# Patient Record
Sex: Male | Born: 1970 | Race: Black or African American | Hispanic: No | Marital: Single | Smoking: Never smoker
Health system: Southern US, Academic
[De-identification: ages and names within clinical notes are randomized; demographics above are authoritative.]

## PROBLEM LIST (undated history)

## (undated) DIAGNOSIS — M1990 Unspecified osteoarthritis, unspecified site: Secondary | ICD-10-CM

## (undated) DIAGNOSIS — I10 Essential (primary) hypertension: Secondary | ICD-10-CM

## (undated) DIAGNOSIS — E119 Type 2 diabetes mellitus without complications: Secondary | ICD-10-CM

## (undated) DIAGNOSIS — G4730 Sleep apnea, unspecified: Secondary | ICD-10-CM

## (undated) DIAGNOSIS — Z87898 Personal history of other specified conditions: Secondary | ICD-10-CM

## (undated) DIAGNOSIS — E785 Hyperlipidemia, unspecified: Secondary | ICD-10-CM

## (undated) DIAGNOSIS — Z9989 Dependence on other enabling machines and devices: Secondary | ICD-10-CM

## (undated) DIAGNOSIS — R6889 Other general symptoms and signs: Secondary | ICD-10-CM

## (undated) DIAGNOSIS — M109 Gout, unspecified: Secondary | ICD-10-CM

## (undated) DIAGNOSIS — I82409 Acute embolism and thrombosis of unspecified deep veins of unspecified lower extremity: Secondary | ICD-10-CM

## (undated) HISTORY — DX: Gout, unspecified: M10.9

## (undated) HISTORY — DX: Type 2 diabetes mellitus without complications: E11.9

## (undated) HISTORY — DX: Sleep apnea, unspecified: G47.30

## (undated) HISTORY — DX: Unspecified osteoarthritis, unspecified site: M19.90

## (undated) HISTORY — PX: MOUTH SURGERY: SHX715

---
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
## 2015-08-02 ENCOUNTER — Emergency Department
Admission: EM | Admit: 2015-08-02 | Discharge: 2015-08-02 | Disposition: A | Discharge status: Home / Self Care | Payer: Self-pay | Attending: Emergency Medicine | Admitting: Emergency Medicine

## 2015-08-02 ENCOUNTER — Encounter (HOSPITAL_COMMUNITY): Payer: Self-pay

## 2015-08-02 ENCOUNTER — Emergency Department (EMERGENCY_DEPARTMENT_HOSPITAL): Payer: Self-pay

## 2015-08-02 ENCOUNTER — Emergency Department (HOSPITAL_COMMUNITY): Payer: Self-pay

## 2015-08-02 DIAGNOSIS — R109 Unspecified abdominal pain: Secondary | ICD-10-CM

## 2015-08-02 DIAGNOSIS — R1032 Left lower quadrant pain: Secondary | ICD-10-CM

## 2015-08-02 DIAGNOSIS — I10 Essential (primary) hypertension: Secondary | ICD-10-CM | POA: Insufficient documentation

## 2015-08-02 HISTORY — DX: Essential (primary) hypertension: I10

## 2015-08-02 LAB — URINALYSIS, MACROSCOPIC
BILIRUBIN: NEGATIVE mg/dL
BLOOD: NEGATIVE mg/dL
COLOR: NORMAL
GLUCOSE: NEGATIVE mg/dL
KETONES: NEGATIVE mg/dL
LEUKOCYTES: NEGATIVE WBCs/uL
NITRITE: NEGATIVE
PH: 7 (ref 5.0–8.0)
PROTEIN: NEGATIVE mg/dL
SPECIFIC GRAVITY: 1.023 (ref 1.005–1.030)
UROBILINOGEN: NEGATIVE mg/dL

## 2015-08-02 LAB — CBC WITH DIFF
BASOPHIL #: 0.06 x10ˆ3/uL (ref 0.00–0.20)
BASOPHIL %: 1 %
EOSINOPHIL #: 0.08 x10ˆ3/uL (ref 0.00–0.50)
EOSINOPHIL %: 2 %
HCT: 41.9 % (ref 36.7–47.0)
HGB: 13.4 g/dL (ref 12.5–16.3)
LYMPHOCYTE #: 1.38 x10ˆ3/uL (ref 1.00–4.80)
LYMPHOCYTE %: 29 %
MCH: 27.9 pg (ref 27.4–33.0)
MCHC: 32.1 g/dL — ABNORMAL LOW (ref 32.5–35.8)
MCV: 87 fL (ref 78.0–100.0)
MONOCYTE #: 0.5 x10ˆ3/uL (ref 0.30–1.00)
MONOCYTE %: 11 %
MPV: 7.4 fL — ABNORMAL LOW (ref 7.5–11.5)
NEUTROPHIL #: 2.71 x10ˆ3/uL (ref 1.50–7.70)
NEUTROPHIL %: 57 %
PLATELETS: 272 x10?3/uL (ref 140–450)
RBC: 4.82 x10ˆ6/uL (ref 4.06–5.63)
RDW: 13.6 % (ref 12.0–15.0)
WBC: 4.7 x10ˆ3/uL (ref 3.5–11.0)

## 2015-08-02 LAB — BASIC METABOLIC PANEL
ANION GAP: 9 mmol/L (ref 4–13)
BUN/CREA RATIO: 13 (ref 6–22)
BUN: 16 mg/dL (ref 8–25)
CALCIUM: 9.4 mg/dL (ref 8.5–10.2)
CHLORIDE: 102 mmol/L (ref 96–111)
CO2 TOTAL: 30 mmol/L (ref 22–32)
CO2 TOTAL: 30 mmol/L (ref 22–32)
CREATININE: 1.19 mg/dL (ref 0.62–1.27)
ESTIMATED GFR: >59 mL/min/1.73mˆ2 (ref >59)
GLUCOSE: 115 mg/dL (ref 65–139)
POTASSIUM: 3.5 mmol/L (ref 3.5–5.1)
SODIUM: 141 mmol/L (ref 136–145)

## 2015-08-02 MED ORDER — CYCLOBENZAPRINE 5 MG TABLET
5.00 mg | ORAL_TABLET | Freq: Three times a day (TID) | ORAL | 0 refills | Status: DC
Start: 2015-08-02 — End: 2019-08-18

## 2015-08-02 MED ORDER — CYCLOBENZAPRINE 10 MG TABLET
10.00 mg | ORAL_TABLET | ORAL | Status: AC
Start: 2015-08-02 — End: 2015-08-02
  Administered 2015-08-02: 10 mg via ORAL
  Filled 2015-08-02: qty 1

## 2015-08-02 MED ORDER — DIAZEPAM 5 MG/ML INJECTION SYRINGE
2.50 mg | INJECTION | INTRAMUSCULAR | Status: AC
Start: 2015-08-02 — End: 2015-08-02
  Administered 2015-08-02: 2.5 mg via INTRAVENOUS
  Filled 2015-08-02 (×2): qty 2

## 2015-08-02 MED ORDER — SODIUM CHLORIDE 0.9 % IV BOLUS
1000.00 mL | INJECTION | Status: AC
Start: 2015-08-02 — End: 2015-08-02
  Administered 2015-08-02: 0 mL via INTRAVENOUS
  Administered 2015-08-02: 1000 mL via INTRAVENOUS

## 2015-08-02 NOTE — ED Provider Notes (Signed)
Department of Emergency Medicine  HPI - 08/02/2015    Attending: Dr. Read Drivers  Resident: Dr. Annamaria Boots  Chief Complaint:   Flank Pain  History of Present Illness:   Austin Richmond, 45 y.o. male who presents to the ED c/o L flank pain.   Onset 2 days ago while lying in bed. Reports he was rolling over when he had acute onset of pain.   He describes the pain as throbbing in nature and is worsened with movement.    Pt reports he woke up the following morning with decreased L flank pain and notes improvements in pain sx as the day progressed.   Reports reoccurrence in pain sx last evening. States his sx were much worse than initial onset.    Pt states he has been drinking large amounts of water for sx management but denies improvements.    He denies recent falls or traumas.    Pt denies a hx of similar sx.    Denies fever, chills, chest pain, SOB, nausea, vomiting, diarrhea, hematochezia, constipation, dysuria, hematuria, numbness tingling, weakness or any other complaints at this time.    No hx of kidney stones per pt.    PMHx significant for HTN    Review of Systems:      Constitutional: No fever, chills or weakness  Skin: No rashes or diaphoresis  HENT: No headache  Eyes: No vision changes  Cardio: No chest pain  Respiratory: No SOB  GI:  No nausea, vomiting, diarrhea, hematochezia or abdominal pain  GU:  No dysuria, hematuria  MSK: +L flank pain  Neuro: No loss of sensation or focal deficits  Psych: No mood changes  All other systems reviewed and are negative     Medications:  None       Allergies:  No Known Allergies    Past Medical History:  Past Medical History:   Diagnosis Date    HTN (hypertension)          Past Surgical History:  No past surgical history on file.        Social History:  Social History     Social History    Marital status: Married     Spouse name: N/A    Number of children: N/A    Years of education: N/A     Occupational History    Not on file.     Social History Main Topics      Smoking status: Never Smoker    Smokeless tobacco: Not on file    Alcohol use No    Drug use: No    Sexual activity: Yes     Partners: Female     Other Topics Concern    Not on file     Social History Narrative    No narrative on file       Family History:  No family history on file.        Physical Exam:  All nurse's notes reviewed.    ED Triage Vitals   Enc Vitals Group      BP (Non-Invasive) 08/02/15 1359 142/94      Heart Rate 08/02/15 1358 84      Respiratory Rate 08/02/15 1358 20      Temperature 08/02/15 1358 37 C (98.6 F)      Temp src --       SpO2-1 08/02/15 1358 97 %      Weight 08/02/15 1358 145.9 kg (321 lb 10.4 oz)  Height 08/02/15 1358 1.854 m (6\' 1" )      Head Cir --       Peak Flow --       Pain Score --       Pain Loc --       Pain Edu? --       Excl. in [place]? --      Filed Vitals:    08/02/15 1358 08/02/15 1359 08/02/15 1627   BP:  (!) 142/94 (!) 148/94   Pulse: 84  77   Resp: 20  18   Temp: 37 C (98.6 F)     SpO2: 97%  96%     Constitutional: NAD. A+Ox3  HENT:   Head: Normocephalic and atraumatic.   Mouth/Throat: Oropharynx is clear and moist.   Eyes: PERRL. EOMI. Conjunctivae without discharge  Neck: Trachea midline.   Cardiovascular: RRR. No murmurs, rubs or gallops.   Pulmonary/Chest: BS equal bilaterally, good air movement. No wheezes or chest tenderness.   Abdominal: BS +. Abdomen soft. No tenderness, rebound or guarding.  Musculoskeletal: No obvious deformity. No midline spinal tenderness. No step offs or deformities. Able to ambulate and stand on tips of toes without pain. L CVA TTP.   Skin: Warm and dry. No rash, erythema, pallor or cyanosis  Psychiatric: Behavior is normal. Mood and affect congruent.    Neurological: Alert&Ox3. Grossly intact.     Labs:  Results for orders placed or performed during the hospital encounter of 08/02/15 (from the past 24 hour(s))   CBC/DIFF    Narrative    The following orders were created for panel order CBC/DIFF.  Procedure                                Abnormality         Status                     ---------                               -----------         ------                     CBC WITH DIFF[[id number]]                Abnormal            Final result                 Please view results for these tests on the individual orders.   BASIC METABOLIC PANEL, NON-FASTING   Result Value Ref Range    SODIUM 141 136 - 145 mmol/L    POTASSIUM 3.5 3.5 - 5.1 mmol/L    CHLORIDE 102 96 - 111 mmol/L    CO2 TOTAL 30 22 - 32 mmol/L    ANION GAP 9 4 - 13 mmol/L    CALCIUM 9.4 8.5 - 10.2 mg/dL    GLUCOSE 115 65 - 139 mg/dL    BUN 16 8 - 25 mg/dL    CREATININE 1.19 0.62 - 1.27 mg/dL    BUN/CREA RATIO 13 6 - 22    ESTIMATED GFR >59 >59 mL/min/1.64m2   URINALYSIS, MACROSCOPIC AND MICROSCOPIC W/CULTURE REFLEX    Narrative    The following orders were created for panel order URINALYSIS, MACROSCOPIC AND  MICROSCOPIC W/CULTURE REFLEX.  Procedure                               Abnormality         Status                     ---------                               -----------         ------                     URINALYSIS, MACROSCOPIC[[id number]]                          Final result                 Please view results for these tests on the individual orders.   CBC WITH DIFF   Result Value Ref Range    WBC 4.7 3.5 - 11.0 x103/uL    RBC 4.82 4.06 - 5.63 x106/uL    HGB 13.4 12.5 - 16.3 g/dL    HCT 41.9 36.7 - 47.0 %    MCV 87.0 78.0 - 100.0 fL    MCH 27.9 27.4 - 33.0 pg    MCHC 32.1 (L) 32.5 - 35.8 g/dL    RDW 13.6 12.0 - 15.0 %    PLATELETS 272 140 - 450 x103/uL    MPV 7.4 (L) 7.5 - 11.5 fL    NEUTROPHIL % 57 %    LYMPHOCYTE % 29 %    MONOCYTE % 11 %    EOSINOPHIL % 2 %    BASOPHIL % 1 %    NEUTROPHIL # 2.71 1.50 - 7.70 x103/uL    LYMPHOCYTE # 1.38 1.00 - 4.80 x103/uL    MONOCYTE # 0.50 0.30 - 1.00 x103/uL    EOSINOPHIL # 0.08 0.00 - 0.50 x103/uL    BASOPHIL # 0.06 0.00 - 0.20 x103/uL   URINALYSIS, MACROSCOPIC   Result Value Ref Range    SPECIFIC GRAVITY 1.023 1.005 -  1.030    GLUCOSE Negative Negative mg/dL    PROTEIN Negative Negative mg/dL    BILIRUBIN Negative Negative mg/dL    UROBILINOGEN Negative Negative mg/dL    PH 7.0 5.0 - 8.0    BLOOD Negative Negative mg/dL    KETONES Negative Negative mg/dL    NITRITE Negative Negative    LEUKOCYTES Negative Negative WBCs/uL    APPEARANCE Clear Clear    COLOR Normal (Yellow) Normal (Yellow)    URINALYSIS COMMENTS      Narrative    Test did not meet guideline to perform Urine Culture.       Imaging:    Results for orders placed or performed during the hospital encounter of 08/02/15 (from the past 72 hour(s))   CT ABDOMEN PELVIS WO IV CONTRAST     Status: None    Narrative    Fermin LEWIS Zelek  Male, 45 years old.    CT ABDOMEN PELVIS WO IV CONTRAST performed on 08/02/2015 5:11 PM.    REASON FOR EXAM:  L flank pain    TECHNIQUE: CT imaging of the abdomen and pelvis without contrast.    RADIATION DOSE: 1993.60 mGycm    COMPARISON: None available.    FINDINGS: Overall  evaluation of solid organs and vasculature is limited   due to lack of intravenous contrast.    Lung Bases: No focal consolidation or suspicious pulmonary nodules are   seen within the included portions of the lungs.    Liver: The liver is normal in size.    Gallbladder and biliary tree: The gallbladder is contracted. No intra- or   extra-hepatic biliary ductal dilatation is identified.    Pancreas: Normal.    Spleen: Normal.    Adrenals: Normal.    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   No radiopaque calculi or hydronephrosis is identified. No solid renal   lesions are appreciated. The bilateral ureters are of normal caliber   throughout their visualized extent.    Bladder: The bladder is partially decompressed without asymmetric wall   thickening or perivesicular stranding.    Reproductive Organs: The prostate gland is normal in size.  There is suggestion of high-riding bilateral testicles.  However, the inguinal canals have been partially  imaged.    Bowel: Evaluation of the bowel is limited due to nonopacification and   incomplete distention; however, there is no evidence to suggest   obstruction. The appendix is normal in size without inflammation.    Lymph Nodes:  Retroperitoneal: No enlarged retroperitoneal lymph nodes.  Mesenteric: No enlarged mesenteric lymph nodes.  Pelvic: No enlarged pelvic lymph nodes.    Peritoneum: No ascites or free air. No other fluid collection.    Vessels: Evaluation is limited due to lack of intravenous contrast. The   abdominal aorta is of normal caliber. Minimal calcific atherosclerotic   disease is appreciated.    Abdominal Wall: A small fat-containing umbilical hernia is noted. Soft   tissues of the abdominal wall are otherwise unremarkable.    Bones: Vertebral body heights and alignment are maintained. No suspicious osseous lesions or other acute osseous abnormalities are appreciated.        Impression    1. No evidence of acute abdominal or pelvic process.   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   3. Suggestion of high-riding bilateral testicles. Clinical correlation is requested.         Orders Placed This Encounter    CT ABDOMEN PELVIS WO IV CONTRAST    CBC/DIFF    BASIC METABOLIC PANEL, NON-FASTING    URINALYSIS, MACROSCOPIC AND MICROSCOPIC W/CULTURE REFLEX    CBC WITH DIFF    URINALYSIS, MACROSCOPIC    INSERT & MAINTAIN PERIPHERAL IV ACCESS    NS bolus infusion 1,000 mL    diazePAM (VALIUM) 5 mg/mL injection    cyclobenzaprine (FLEXERIL) tablet       Abnormal Lab results:  Labs Reviewed   CBC WITH DIFF - Abnormal; Notable for the following:        Result Value    MCHC 32.1 (*)     MPV 7.4 (*)     All other components within normal limits   BASIC METABOLIC PANEL - Normal   CBC/DIFF    Narrative:     The following orders were created for panel order CBC/DIFF.  Procedure                               Abnormality         Status                     ---------                                -----------         ------  CBC WITH DIFF[[id number]]                Abnormal            Final result                 Please view results for these tests on the individual orders.   URINALYSIS, MACROSCOPIC AND MICROSCOPIC W/CULTURE REFLEX    Narrative:     The following orders were created for panel order URINALYSIS, MACROSCOPIC AND MICROSCOPIC W/CULTURE REFLEX.  Procedure                               Abnormality         Status                     ---------                               -----------         ------                     URINALYSIS, MACROSCOPIC[[id number]]                          Final result                 Please view results for these tests on the individual orders.   URINALYSIS, MACROSCOPIC    Narrative:     Test did not meet guideline to perform Urine Culture.       Plan: Appropriate labs and imaging ordered. Medical Records reviewed.    Therapy/Procedures/Course/MDM:   Patient was vitally stable throughout visit.   Well appearing  No midline back pain  Labs WNL  CT w/out acute abnormality, informed of horseshoe kidney  Pain resolved w/ valium, flexeril  Possibly MSK  Will d/c w/ flexeril  Discussed RP  F/u w/ FM    Consults:   None    Impression: L flank pain    Disposition:     he will follow up with FM .   flexeril prescribed.  Medication instructions were discussed with the patient/patient's family.  It was advised that the patient return to the ED with any new, concerning or worsening symptoms and follow up as directed.   The patient's family verbalized understanding of all instructions and had no further questions or concerns.     I am scribing for, and in the presence of, Dr. Annamaria Boots for services provided on 08/02/2015  [place]     I personally performed the services described in this documentation, as scribed  in my presence, and it is both accurate  and complete.    Vida Roller, MD    Vida Roller, MD  08/02/2015, 18:51     I saw and  examined the patient.  I directly supervised the patient's care.  I discussed the patient's care with the resident/midlevel and I agree with the findings, diagnosis, and plan as documented in the primary note.  Any exceptions, additions, or corrections are noted below.    Kaleen Mask, MD 08/08/2015, 15:46  Nicki Reaper Forrest Moron, MD  Attending Physician  Emergency Medicine

## 2015-08-02 NOTE — ED Nurses Note (Signed)
Iv established and labs sent. Pt informed not to leave with IV.

## 2015-08-02 NOTE — ED Nurses Note (Signed)
Report given to M. Milko, Therapist, sports.  Transferring pt care at this time.

## 2015-08-02 NOTE — ED Attending Note (Signed)
Triage:  Flank Pain (Pt states he is having L flank pain, started 1 day ago. Denies blood in urine, denies pain with urination, denies h/o kidney stones. )    Filed Vitals:    08/02/15 1358 08/02/15 1359 08/02/15 1627   BP:  (!) 142/94 (!) 148/94   Pulse: 84  77   Resp: 20  18   Temp: 37 C (98.6 F)     SpO2: 97%  96%         Austin Richmond is a 45 y.o. male p/w l flank pain, 1 day, no hx stones,  No hematuria       Pertinent Exam  AOx3, CN intact, EOMI  RRR, no r/m/g  CTAB, no resp distress  Abd s, nt, nd, no peritoneal signs, mild left flank pain      ED Course        Labs Reviewed   CBC WITH DIFF - Abnormal; Notable for the following:        Result Value    MCHC 32.1 (*)     MPV 7.4 (*)     All other components within normal limits   BASIC METABOLIC PANEL - Normal   CBC/DIFF    Narrative:     The following orders were created for panel order CBC/DIFF.  Procedure                               Abnormality         Status                     ---------                               -----------         ------                     CBC WITH DIFF[[id number]]                Abnormal            Final result                 Please view results for these tests on the individual orders.   URINALYSIS, MACROSCOPIC AND MICROSCOPIC W/CULTURE REFLEX    Narrative:     The following orders were created for panel order URINALYSIS, MACROSCOPIC AND MICROSCOPIC W/CULTURE REFLEX.  Procedure                               Abnormality         Status                     ---------                               -----------         ------                     URINALYSIS, MACROSCOPIC[[id number]]                          Final result  Please view results for these tests on the individual orders.   URINALYSIS, MACROSCOPIC    Narrative:     Test did not meet guideline to perform Urine Culture.     CT ABDOMEN PELVIS WO IV CONTRAST   Final Result   1. No evidence of acute abdominal or pelvic process.    2. Horseshoe kidney without  hydronephrosis, renal or ureteral calculi.    3. Suggestion of high-riding bilateral testicles. Clinical correlation is requested.             Results reviewed  Records Reviewed  Labs and imaging reviewed    Summary: pt with normal labs and imaging as above, present with MSK flank pain vs stone.  Resting comfortably post meds.  Dc with PCP follow up       Impressions: back pain  Dispo: home    Kaleen Mask, MD  08/02/2015, 18:27  Emergency Medicine Attending

## 2015-08-02 NOTE — Discharge Instructions (Signed)
Return to the ED with any new or worrying symptoms such as fevers, worsening pain.

## 2015-08-02 NOTE — ED Nurses Note (Signed)
Pt discharged pt given script

## 2015-08-02 NOTE — ED Nurses Note (Signed)
Pt presents to [facility] ED with c/o L flank pain.  Pt denies recent trauma or abnormal urinary symptoms.  Rates pain as 7/10.  PIV placed in triage; labs drawn and sent as ordered.  Pt VS updated.  Medicated per MAR.  Pt transported to CT by ED tech.  To be moved to RP after scan.  No s/s distress.

## 2015-08-03 LAB — URINALYSIS, MICROSCOPIC
HYALINE CASTS: 25 /LPF — ABNORMAL HIGH (ref <4.0)
RBCS: <1 /HPF (ref <6.0)
WBCS: 1 /HPF (ref <4.0)

## 2015-10-15 ENCOUNTER — Emergency Department (HOSPITAL_COMMUNITY): Payer: Self-pay

## 2015-10-15 ENCOUNTER — Emergency Department
Admission: EM | Admit: 2015-10-15 | Discharge: 2015-10-15 | Disposition: A | Discharge status: Home / Self Care | Payer: Self-pay | Attending: Emergency Medicine | Admitting: Emergency Medicine

## 2015-10-15 ENCOUNTER — Emergency Department (EMERGENCY_DEPARTMENT_HOSPITAL): Payer: Self-pay

## 2015-10-15 ENCOUNTER — Encounter (HOSPITAL_COMMUNITY): Payer: Self-pay

## 2015-10-15 DIAGNOSIS — Z87891 Personal history of nicotine dependence: Secondary | ICD-10-CM | POA: Insufficient documentation

## 2015-10-15 DIAGNOSIS — R079 Chest pain, unspecified: Secondary | ICD-10-CM

## 2015-10-15 DIAGNOSIS — Z7982 Long term (current) use of aspirin: Secondary | ICD-10-CM | POA: Insufficient documentation

## 2015-10-15 DIAGNOSIS — F1290 Cannabis use, unspecified, uncomplicated: Secondary | ICD-10-CM | POA: Insufficient documentation

## 2015-10-15 DIAGNOSIS — I10 Essential (primary) hypertension: Secondary | ICD-10-CM | POA: Insufficient documentation

## 2015-10-15 DIAGNOSIS — Z79899 Other long term (current) drug therapy: Secondary | ICD-10-CM | POA: Insufficient documentation

## 2015-10-15 LAB — CBC WITH DIFF
BASOPHIL #: 0.04 x10ˆ3/uL (ref 0.00–0.20)
BASOPHIL %: 1 %
EOSINOPHIL #: 0.08 x10ˆ3/uL (ref 0.00–0.50)
EOSINOPHIL %: 2 %
HCT: 45.4 % (ref 36.7–47.0)
HGB: 14.5 g/dL (ref 12.5–16.3)
HGB: 14.5 g/dL (ref 12.5–16.3)
LYMPHOCYTE #: 1.36 x10ˆ3/uL (ref 1.00–4.80)
LYMPHOCYTE %: 35 %
MCH: 28.3 pg (ref 27.4–33.0)
MCHC: 31.9 g/dL — ABNORMAL LOW (ref 32.5–35.8)
MCV: 88.6 fL (ref 78.0–100.0)
MONOCYTE #: 0.49 x10ˆ3/uL (ref 0.30–1.00)
MONOCYTE %: 13 %
MPV: 8 fL (ref 7.5–11.5)
NEUTROPHIL #: 1.88 x10ˆ3/uL (ref 1.50–7.70)
NEUTROPHIL %: 49 %
PLATELETS: 254 x10ˆ3/uL (ref 140–450)
RBC: 5.13 x10ˆ6/uL (ref 4.06–5.63)
RDW: 14.1 % (ref 12.0–15.0)
WBC: 3.9 x10ˆ3/uL (ref 3.5–11.0)

## 2015-10-15 LAB — ECG 12-LEAD
Atrial Rate: 79 {beats}/min
Calculated P Axis: 22 degrees
Calculated R Axis: -28 degrees
PR Interval: 186 ms
QRS Duration: 94 ms
QT Interval: 380 ms
QTC Calculation: 435 ms
Ventricular rate: 79 {beats}/min

## 2015-10-15 LAB — HEPATIC FUNCTION PANEL
ALBUMIN: 3.5 g/dL (ref 3.5–5.0)
ALKALINE PHOSPHATASE: 60 U/L (ref <150)
ALT (SGPT): 31 U/L (ref <55)
AST (SGOT): 33 U/L (ref 8–48)
AST (SGOT): 33 U/L (ref 8–48)
BILIRUBIN DIRECT: 0.2 mg/dL (ref <0.3)
BILIRUBIN TOTAL: 0.5 mg/dL (ref 0.3–1.3)
BILIRUBIN TOTAL: 0.5 mg/dL (ref 0.3–1.3)
PROTEIN TOTAL: 7.9 g/dL (ref 6.4–8.3)
PROTEIN TOTAL: 7.9 g/dL (ref 6.4–8.3)

## 2015-10-15 LAB — BASIC METABOLIC PANEL
ANION GAP: 6 mmol/L (ref 4–13)
ANION GAP: 6 mmol/L (ref 4–13)
BUN/CREA RATIO: 16 (ref 6–22)
BUN: 17 mg/dL (ref 8–25)
CALCIUM: 9 mg/dL (ref 8.5–10.2)
CHLORIDE: 104 mmol/L (ref 96–111)
CO2 TOTAL: 30 mmol/L (ref 22–32)
CREATININE: 1.07 mg/dL (ref 0.62–1.27)
ESTIMATED GFR: >59 mL/min/1.73mˆ2 (ref >59)
GLUCOSE: 140 mg/dL — ABNORMAL HIGH (ref 65–139)
POTASSIUM: 3.6 mmol/L (ref 3.5–5.1)
SODIUM: 140 mmol/L (ref 136–145)

## 2015-10-15 LAB — PTT (PARTIAL THROMBOPLASTIN TIME): APTT: 33.1 s (ref 25.1–36.5)

## 2015-10-15 LAB — TROPONIN-I: TROPONIN I: 23 ng/L (ref 0–30)

## 2015-10-15 LAB — PT/INR
INR: 0.93 (ref 0.80–1.20)
PROTHROMBIN TIME: 10.5 s (ref 9.0–13.6)

## 2015-10-15 LAB — LIPASE: LIPASE: 43 U/L (ref 10–80)

## 2015-10-15 MED ORDER — IOPAMIDOL 370 MG IODINE/ML (76 %) INTRAVENOUS SOLUTION
80.00 mL | INTRAVENOUS | Status: AC
Start: 2015-10-15 — End: 2015-10-15
  Administered 2015-10-15: 80 mL via INTRAVENOUS

## 2015-10-15 MED ORDER — ASPIRIN 81 MG CHEWABLE TABLET
324.0000 mg | CHEWABLE_TABLET | ORAL | Status: AC
Start: 2015-10-15 — End: 2015-10-15
  Administered 2015-10-15: 324 mg via ORAL
  Filled 2015-10-15: qty 4

## 2015-10-15 NOTE — ED Nurses Note (Signed)
Patient reports CP that started Friday. Right sided pain. Patient reports pain started when setting on toilet and sneezed. Said Saturday if felt better and then started to hurt again last night. Denies other associated sx's with CP. 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 Denies cardiac hx other than HTN.

## 2015-10-15 NOTE — Discharge Instructions (Signed)
Uncertain Causes of Chest Pain    Chest pain can happen for a number of reasons. Sometimes the cause can't be determined. If your condition does not seem serious, and your pain does not appear to be coming from your heart, your healthcare provider may recommend watching it closely. Sometimes the signs of a serious problem take more time to appear. Many problems not related to your heart can cause chest pain.These include:  · Musculoskeletal. Costochondritis, an inflammation of the tissues around the ribs that can occur from trauma or overuse injuries  · Respiratory. Pneumonia, pneumothorax, or pneumonitis (inflammation of the lining of the chest and lungs)  · Gastrointestinal. Esophageal reflux, heartburn, or gallbladder disease  · Anxiety and panic disorders  · Nerve compression and neuritis  · Miscellaneous problems such as aortic aneurysm or pulmonary embolism (a blood clot in the lungs)  Home care  After your visit, follow these recommendations:  · Rest today and avoid strenuous activity.  · Take any prescribed medicine as directed.  · Be aware of any recurrent chest pain and notice any changes  Follow-up care  Follow up with your healthcare provider if you do not start to feel better within 24 hours, or as advised.  Call 911  Call 911 if any of these occur:  · A change in the type of pain: if it feels different, becomes more severe, lasts longer, or begins to spread into your shoulder, arm, neck, jaw or back  · Shortness of breath or increased pain with breathing  · Weakness, dizziness, or fainting  · Rapid heart beat  · Crushing sensation in your chest  When to seek medical advice  Call your healthcare provider right away if any of the following occur:  · Cough with dark colored sputum (phlegm) or blood  · Fever of 100.4ºF (38ºC) or higher, or as directed by your healthcare provider  · Swelling, pain or redness in one leg  · Shortness of breath  Date Last Reviewed: 02/16/2014  © 2000-2017 [employer] [address], [place] [address]. All rights reserved. This information is not intended as a substitute for professional medical care. Always follow your healthcare professional's instructions.

## 2015-10-15 NOTE — ED Nurses Note (Signed)
Patient discharged, instructions to patient. States no questions. NAD at discharge. Stable gait at discharge.

## 2015-10-15 NOTE — ED Nurses Note (Signed)
No distress patient sleeping at present time. Patient respirations are easy and non labored.

## 2015-10-15 NOTE — ED Attending Note (Signed)
I was physically present in the treatment area and immediately available for consultation during the period of this patients care. I have reviewed the documented assessment and plan and agree.  This does not imply that I physically saw and examined the patient at the time of service.    ECG reviewed. Please see the ECG tracing for the digital print out of the ventricular rate, P-R interval, QRS duration, QT duration, and P-R-T axes. I have reviewed these measurements and agree with them.  Rate: 79.  No acute ischemia noted.        Sherryl Barters. Quentin Cornwall, MD

## 2015-10-15 NOTE — ED Nurses Note (Signed)
Pt to CT via [facility]

## 2015-10-15 NOTE — ED Nurses Note (Signed)
IV unsucessful to LFA. Blood work obtained.

## 2015-10-15 NOTE — ED Provider Notes (Signed)
Department of Emergency Medicine  HPI - 10/15/2015    Attending: Dr. Adolm Joseph  Advanced Practice Provider: Kizzie Fantasia, PA-C    Chief Complaint:   Chest pain    History of Present Illness:   Austin Richmond, 45 y.o. male   Pt reports to the ED via POV with c/o chest pain x 2 days. Patient reports the chest pain is an intermittent stabbing sensation that is primarily right sided just under his ribs, onset after a sneeze, and states he avoids coughing secondary to pain. 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 Patient states he has been compliant with this medication. 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 Patient denies SOB, cough, nausea, vomiting, abdominal pain, tobacco use, any cardiac history or surgery, and any other concerns or complaints at this time. He spoke with a friend who is a Marine scientist, who advised him to come to ED with concern for PE. PMHx HTN. Patient endorses marijuana use, no tobacco use. Denies significant family hx of cardiac disease. NKDA     History Limitations: None    Review of Systems:   Constitutional: No fever, chills, or weakness   Skin: No rashes or diaphoresis   HENT: No congestion   Eyes: No vision changes, discharge   Cardio: No palpitations or leg swelling  +chest pain   Respiratory: No cough, wheezing or SOB   GI:  No nausea, vomiting, diarrhea, constipation or abdominal pain   GU:  No dysuria, hematuria, polyuria   MSK: No joint or back pain   Neuro: No loss of sensation, focal deficits, headaches, or LOC  All other symptoms reviewed and are negative    Medications:  Prior to Admission Medications   Prescriptions Last Dose Informant Patient Reported? Taking?   aspirin 81 mg Oral Tablet, Chewable   Yes No   Sig: Take 81 mg by mouth Once a day   cyclobenzaprine (FLEXERIL) 5 mg Oral Tablet   No No   Sig: Take 1 Tab (5 mg total) by mouth Three times a day   lisinopril (PRINIVIL) 20 mg Oral Tablet   Yes No   Sig: Take 20 mg by  mouth Once a day      Facility-Administered Medications: None     Allergies:  No Known Allergies    Past Medical History:  Past Medical History:   Diagnosis Date    HTN (hypertension)      Past Surgical History:  History reviewed. No pertinent surgical history.    Social History:  Social History     Social History    Marital status: Married     Spouse name: N/A    Number of children: N/A    Years of education: N/A     Occupational History    Not on file.     Social History Main Topics    Smoking status: Former Smoker    Smokeless tobacco: Former Systems developer    Alcohol use No    Drug use: No    Sexual activity: Yes     Partners: Female     Other Topics Concern    Not on file     Social History Narrative     Family History:  No family history on file.    Physical Exam:  All nurse's notes reviewed.  Filed Vitals:    10/15/15 1008 10/15/15 1324   BP: (!) 168/92 (!) 170/104   Pulse: 86 76  Resp: 20 20   Temp: 36.2 C (97.2 F)    SpO2: 98% 100%      Constitutional: NAD. A+Ox3   HENT:    Head: NC AT    Mouth/Throat: Oropharynx is clear and moist.    Eyes: PERRL, EOMI, Conjunctivae without discharge   Neck: Supple.    Cardiovascular: RRR, No murmurs, rubs or gallops.    Pulmonary/Chest: BS equal bilaterally, good air movement. No respiratory distress. No wheezes, or rales. TTP over right anterior ribs.   Abdominal: BS +. Abdomen soft. No tenderness, rebound or guarding.                Musculoskeletal: No obvious deformity noted. LLE edema.     Skin: Warm and dry. No rash, erythema, pallor or cyanosis   Psychiatric: Behavior is normal. Mood and affect congruent.     Neurological: Alert&Ox3. Grossly intact.     Labs:  Results for orders placed or performed during the hospital encounter of 10/15/15 (from the past 24 hour(s))   BASIC METABOLIC PANEL, NON-FASTING   Result Value Ref Range    SODIUM 140 136 - 145 mmol/L    POTASSIUM 3.6 3.5 - 5.1 mmol/L    CHLORIDE 104 96 - 111 mmol/L    CO2 TOTAL 30 22 - 32  mmol/L    ANION GAP 6 4 - 13 mmol/L    CALCIUM 9.0 8.5 - 10.2 mg/dL    GLUCOSE 140 (H) 65 - 139 mg/dL    BUN 17 8 - 25 mg/dL    CREATININE 1.07 0.62 - 1.27 mg/dL    BUN/CREA RATIO 16 6 - 22    ESTIMATED GFR >59 >59 mL/min/1.64m2    Narrative    Hemolysis can alter results at this level (slight).   CBC/DIFF    Narrative    The following orders were created for panel order CBC/DIFF.  Procedure                               Abnormality         Status                     ---------                               -----------         ------                     CBC WITH DIFF[[id number]]                Abnormal            Final result                 Please view results for these tests on the individual orders.   HEPATIC FUNCTION PANEL   Result Value Ref Range    ALBUMIN 3.5 3.5 - 5.0 g/dL    ALKALINE PHOSPHATASE 60 <150 U/L    ALT (SGPT) 31 <55 U/L    AST (SGOT) 33 8 - 48 U/L    BILIRUBIN TOTAL 0.5 0.3 - 1.3 mg/dL    BILIRUBIN DIRECT 0.2 <0.3 mg/dL    PROTEIN TOTAL 7.9 6.4 - 8.3 g/dL    Narrative    Hemolysis can alter results at this level (slight).   LIPASE   Result  Value Ref Range    LIPASE 43 10 - 80 U/L   PT/INR   Result Value Ref Range    PROTHROMBIN TIME 10.5 9.0 - 13.6 seconds    INR 0.93 0.80 - 1.20    Narrative    Coumadin therapy INR range for Conventional Anticoagulation is 2.0 to 3.0 and for Intensive Anticoagulation 2.5 to 3.5.   PTT (PARTIAL THROMBOPLASTIN TIME)   Result Value Ref Range    APTT 33.1 25.1 - 36.5 seconds    Narrative    Therapeutic range for unfractionated heparin is 60.0-100.0 seconds.   TROPONIN-I   Result Value Ref Range    TROPONIN I 23 0 - 30 ng/L   CBC WITH DIFF   Result Value Ref Range    WBC 3.9 3.5 - 11.0 x103/uL    RBC 5.13 4.06 - 5.63 x106/uL    HGB 14.5 12.5 - 16.3 g/dL    HCT 45.4 36.7 - 47.0 %    MCV 88.6 78.0 - 100.0 fL    MCH 28.3 27.4 - 33.0 pg    MCHC 31.9 (L) 32.5 - 35.8 g/dL    RDW 14.1 12.0 - 15.0 %    PLATELETS 254 140 - 450 x103/uL    MPV 8.0 7.5 - 11.5 fL    NEUTROPHIL  % 49 %    LYMPHOCYTE % 35 %    MONOCYTE % 13 %    EOSINOPHIL % 2 %    BASOPHIL % 1 %    NEUTROPHIL # 1.88 1.50 - 7.70 x103/uL    LYMPHOCYTE # 1.36 1.00 - 4.80 x103/uL    MONOCYTE # 0.49 0.30 - 1.00 x103/uL    EOSINOPHIL # 0.08 0.00 - 0.50 x103/uL    BASOPHIL # 0.04 0.00 - 0.20 x103/uL     Imaging:  Results for orders placed or performed during the hospital encounter of 10/15/15 (from the past 72 hour(s))   XR AP MOBILE CHEST     Status: None (Preliminary result)    Narrative    Evrett LEWIS Ronnald Ramp  Male, 45 years old.    XR AP MOBILE CHEST performed on 10/15/2015 10:52 AM.    REASON FOR EXAM:  chest pain    TECHNIQUE: 1 views/1 images submitted for interpretation.    COMPARISON:  None.    FINDINGS:  The heart size is at the upper limits of normal. Pulmonary  vasculature is within normal limits. No focal consolidation, pleural  effusion, or pneumothorax is identified. Osseous structures are without  acute abnormality.      Impression    Borderline heart size, otherwise no acute cardiopulmonary abnormality.     CT CHEST FOR PULMONARY EMBOLUS W IV CONTRAST     Status: None (Preliminary result)    Narrative    CT CHEST FOR PULMONARY EMBOLUS W IV CONTRAST performed on 10/15/2015 1:02 PM    INDICATION: 45 years old Male  chest pain, dvt    TECHNIQUE: Axial images from the thoracic inlet through the lungs obtained  after administration of IV contrast with reformatted coronal and sagittal  images, utilizing soft tissue and lung algorithms    CONTRAST: 80 cc of Isovue 370    RADIATION DOSE: 968.00 mGycm    COMPARISON: None.    FINDINGS:  Bolus timing is suboptimal. No filling defect seen in the main, lobar, or  proximal segmental pulmonary arteries to suggest embolus.    The central airways are patent. The lungs are clear without focal  consolidation,  pleural effusion, or pneumothorax.    The heart is mildly enlarged without pericardial effusion. The aorta is  normal in caliber. Main pulmonary artery is mildly prominent.  There is no  mediastinal adenopathy. The visualized liver, gallbladder, spleen, and  pancreas are unremarkable. There is minimal thickening of the adrenal  glands. 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      Impression    1. No evidence for pulmonary embolus or other acute intrathoracic  abnormality.  2. Redemonstration of a horseshoe kidney.       Orders Placed This Encounter    XR AP MOBILE CHEST    CT CHEST FOR PULMONARY EMBOLUS W IV CONTRAST    BASIC METABOLIC PANEL, NON-FASTING    CBC/DIFF    HEPATIC FUNCTION PANEL    LIPASE    PT/INR    PTT (PARTIAL THROMBOPLASTIN TIME)    TROPONIN-I    CBC WITH DIFF    ECG 12-LEAD    aspirin chewable tablet 324 mg    iopamidol (ISOVUE-370) 76% infusion     Abnormal Lab results:  Labs Reviewed   BASIC METABOLIC PANEL - Abnormal; Notable for the following:        Result Value    GLUCOSE 140 (*)     All other components within normal limits    Narrative:     Hemolysis can alter results at this level (slight).   CBC WITH DIFF - Abnormal; Notable for the following:     MCHC 31.9 (*)     All other components within normal limits   HEPATIC FUNCTION PANEL - Normal    Narrative:     Hemolysis can alter results at this level (slight).   LIPASE - Normal   PT/INR - Normal    Narrative:     Coumadin therapy INR range for Conventional Anticoagulation is 2.0 to 3.0 and for Intensive Anticoagulation 2.5 to 3.5.   PTT (PARTIAL THROMBOPLASTIN TIME) - Normal    Narrative:     Therapeutic range for unfractionated heparin is 60.0-100.0 seconds.   TROPONIN-I - Normal   CBC/DIFF    Narrative:     The following orders were created for panel order CBC/DIFF.  Procedure                               Abnormality         Status                     ---------                               -----------         ------                     CBC WITH DIFF[[id number]]                Abnormal            Final result                 Please view results for these tests on the individual orders.           ECG: See attending note.    Plan: Appropriate labs and imaging ordered. Medical Records reviewed.    Therapy/Procedures/Course/MDM:   Patient was vitally stable throughout visit.  Imaging remarkable for: borderline heart size, otherwise no acute cardiomegaly abnormality on Mobile Chest XR  Lab results as shown above, no acute abnormality.   Patient received: Aspirin and Isovue-370  2:03 PM : CT PE was negative and the patient was advised to follow up with his PCP.  Results discussed with patient.  he had improvement with initial ED management. he was given the opportunity to ask questions.      Consults:    None    Impression:   Encounter Diagnosis   Name Primary?    Chest pain, unspecified type Yes       Disposition:  Discharged   Following the above history, physical exam, and studies, the patient was deemed stable and suitable for discharge.   he will follow up with PCP in 2 days    No medical therapies were prescribed.     It was advised that the patient return to the ED with any new, concerning or worsening symptoms and follow up as directed.    The patient verbalized understanding of all instructions and had no further questions or concerns.     I am scribing for, and in the presence of, Kizzie Fantasia, [place] for services provided on 10/15/2015  Karolee Ohs, [place].     Karolee Ohs, SCRIBE  10/15/2015, 10:17    I personally performed the services described in this documentation, as scribed in my presence, and it is both accurate and complete.   Kizzie Fantasia, PA-C  The co-signing faculty was physically present in the emergency department and available for consultation and did not particpate in the care of this patient.  Kizzie Fantasia, PA-C  10/24/2015, 11:57

## 2015-10-15 NOTE — ED Nurses Note (Signed)
Patient up to bathroom, no distress.

## 2015-10-15 NOTE — ED Nurses Note (Signed)
IV was removed and patient was discharged.

## 2017-10-06 ENCOUNTER — Inpatient Hospital Stay (HOSPITAL_COMMUNITY)
Admission: EM | Admit: 2017-10-06 | Discharge: 2017-10-06 | Disposition: A | Discharge status: Home / Self Care | Payer: Self-pay | Source: Other Acute Inpatient Hospital

## 2017-10-06 DIAGNOSIS — M25521 Pain in right elbow: Secondary | ICD-10-CM

## 2017-10-06 DIAGNOSIS — M109 Gout, unspecified: Secondary | ICD-10-CM

## 2017-12-05 ENCOUNTER — Inpatient Hospital Stay (HOSPITAL_COMMUNITY)
Admission: EM | Admit: 2017-12-05 | Discharge: 2017-12-05 | Disposition: A | Discharge status: Home / Self Care | Payer: Self-pay | Source: Other Acute Inpatient Hospital

## 2017-12-05 DIAGNOSIS — F17290 Nicotine dependence, other tobacco product, uncomplicated: Secondary | ICD-10-CM

## 2017-12-05 DIAGNOSIS — J069 Acute upper respiratory infection, unspecified: Secondary | ICD-10-CM

## 2017-12-05 DIAGNOSIS — R05 Cough: Secondary | ICD-10-CM

## 2017-12-30 ENCOUNTER — Encounter (INDEPENDENT_AMBULATORY_CARE_PROVIDER_SITE_OTHER): Payer: Self-pay | Admitting: Surgery

## 2017-12-30 ENCOUNTER — Ambulatory Visit (INDEPENDENT_AMBULATORY_CARE_PROVIDER_SITE_OTHER): Payer: 59 | Admitting: Surgery

## 2017-12-30 VITALS — BP 151/85 | HR 73 | Ht 73.0 in | Wt 335.0 lb

## 2017-12-30 DIAGNOSIS — K429 Umbilical hernia without obstruction or gangrene: Secondary | ICD-10-CM

## 2017-12-30 NOTE — Progress Notes (Signed)
Name: Austin Richmond  Date of Birth: December 28, 1970  Date: 12/30/2017    Evaluation of umbilical hernia    HPI: Austin Richmond is a pleasant 47 y.o. male who was referred to my office for evaluation and treatment of umbilical hernia.  The patient was being recently evaluated for lymph nodes, per report, he has had a CT scan performed at an outside facility, was told he has an umbilical hernia defect.  The patient does not really feel pain or have any symptoms related to the umbilical hernia with the exception of occasional pressure sensation when he lifts a lot of heavy items at work    The patient has a history of left-sided DVT, he cannot recall the exact date, however he did have a fractured tibia either 2014 or 2015, that resulted in bed stay for approximately 45 days, it seems that shortly thereafter he developed swelling in the lower extremity and ultrasound showed a DVT, he did not have a PE, however it appears he has been on Eliquis since that time        Past Medical History:   Diagnosis Date   . Arthritis    . Diabetes mellitus, type 2 (CMS HCC)    . Gout    . HTN (hypertension)    . Sleep apnea            Past surgical history:    Left lower extremity cast, no other operation        Outpatient Encounter Medications as of 12/30/2017   Medication Sig Dispense Refill   . AMLODIPINE BESYLATE (NORVASC ORAL) Take by mouth     . apixaban (ELIQUIS) 5 mg Oral Tablet Take 5 mg by mouth Twice daily     . aspirin 81 mg Oral Tablet, Chewable Take 81 mg by mouth Once a day     . cyclobenzaprine (FLEXERIL) 5 mg Oral Tablet Take 1 Tab (5 mg total) by mouth Three times a day (Patient not taking: Reported on 12/30/2017) 12 Tab 0   . ertugliflozin pidolate (STEGLATRO ORAL) Take by mouth     . lisinopril (PRINIVIL) 20 mg Oral Tablet Take 20 mg by mouth Once a day     . metformin HCl (METFORMIN ORAL) Take by mouth       No facility-administered encounter medications on file as of 12/30/2017.        No Known  Allergies    Family Medical History:     Problem Relation (Age of Onset)    Diabetes Father, Brother    Hypertension (High Blood Pressure) Mother              Social History     Tobacco Use   . Smoking status: Former Research scientist (life sciences)   . Smokeless tobacco: Former Chief Strategy Officer Use Topics   . Alcohol use: No   . Drug use: Yes     Types: Marijuana       Review of Systems      Review of Systems 12/30/2017   Constitutional: negative   Eyes: negative   Ears, nose, mouth, and throat: negative   Respiratory: negative   Cardiovascular: negative   Gastrointestinal: positive for       + abdominal pain;heartburn   Genitourinary: negative   Integument/breast: negative   Hematologic/lymphatic: negative   Musculoskeletal: positive for       + joint aches;muscle aches   Neurological: negative   Behavioral/Psych: negative   Endocrine: negative   Allergic/Immunologic:  negative             PHYSICAL EXAM:    BP (!) 151/85   Pulse 73   Ht 1.854 m (6\' 1" )   Wt (!) 152 kg (335 lb)   BMI 44.20 kg/m       -Awake, alert and Oriented x3. No acute distress  -Head is atraumatic, normocephalic.  -Neck is supple, has no palpable lymph nodes, no carotid bruit  -Chest with no deformities, clear to auscultation bilaterally.  -Heart has regular rate and rhythm, no rubs, murmurs or gallops.  -Abdomen is soft, non distended non tender to palpation.  small umbilical hernia defect  -Groins are soft with no nodules and no hernias identified.  -Extremities with no clubbing, no edema and no cyanosis.   -neurologically the patient is A&O X 3, has MOE x 4,  And is able to ambulate without assist.          Assessment/PLAN:    ICD-10-CM    1. Umbilical hernia without obstruction and without gangrene K42.9      - the patient has a history of left lower extremity DVT, without PE, it appears he has been on Eliquis for several years now, this seems to be a provoked lower extremity DVT, I advised the patient to follow up with his primary care physician to evaluate  the need to continue on Eliquis at this time  -he currently has morbid obesity, I explained to him that will result in a higher chance of recurrence if repairs attempted, the patient is currently asymptomatic for the most part, he was advised to start a weight loss plan, and presents for evaluation of repair should he have consistent symptoms related to the umbilical hernia defect

## 2017-12-30 NOTE — Nursing Note (Signed)
Patient is here for umb hernia.

## 2018-06-13 ENCOUNTER — Inpatient Hospital Stay (HOSPITAL_COMMUNITY)
Admission: EM | Admit: 2018-06-13 | Discharge: 2018-06-13 | Disposition: A | Discharge status: Home / Self Care | Payer: 59 | Source: Other Acute Inpatient Hospital

## 2018-06-13 DIAGNOSIS — I249 Acute ischemic heart disease, unspecified: Secondary | ICD-10-CM

## 2018-06-13 DIAGNOSIS — R0789 Other chest pain: Secondary | ICD-10-CM

## 2018-06-13 DIAGNOSIS — E876 Hypokalemia: Secondary | ICD-10-CM

## 2018-06-13 DIAGNOSIS — Z87891 Personal history of nicotine dependence: Secondary | ICD-10-CM

## 2018-06-13 DIAGNOSIS — E119 Type 2 diabetes mellitus without complications: Secondary | ICD-10-CM

## 2019-03-23 DIAGNOSIS — R079 Chest pain, unspecified: Secondary | ICD-10-CM

## 2019-04-27 ENCOUNTER — Emergency Department (HOSPITAL_COMMUNITY): Payer: 59

## 2019-04-27 ENCOUNTER — Other Ambulatory Visit: Payer: Self-pay

## 2019-04-27 ENCOUNTER — Emergency Department
Admission: EM | Admit: 2019-04-27 | Discharge: 2019-04-27 | Disposition: A | Discharge status: Home / Self Care | Payer: 59 | Attending: Physician Assistant | Admitting: Physician Assistant

## 2019-04-27 DIAGNOSIS — Z87891 Personal history of nicotine dependence: Secondary | ICD-10-CM | POA: Insufficient documentation

## 2019-04-27 DIAGNOSIS — K122 Cellulitis and abscess of mouth: Secondary | ICD-10-CM | POA: Insufficient documentation

## 2019-04-27 LAB — STREPTOCOCCUS PYOGENES, PCR: STREPTOCOCCUS PYOGENES, PCR: NOT DETECTED

## 2019-04-27 MED ORDER — PREDNISONE 50 MG TABLET
50.0000 mg | ORAL_TABLET | Freq: Every day | ORAL | 0 refills | Status: AC
Start: 2019-04-27 — End: 2019-05-02

## 2019-04-27 NOTE — ED Nurses Note (Signed)
Patient examined by PA Lianne Bushy. Strep swab obtained and sent to lab.

## 2019-04-27 NOTE — ED Provider Notes (Signed)
49 yo M cc: "my uvula is swollen" for approx 2 weeks after starting jardiance. No difficulty swallowing, SOB, voice changes, or swelling of lips/tongue. Did not take his jardiance today. Denies sore throat, fever, cough. Sx's constant x 1-2 weeks, no exacerbation of sx's recently. States that he has also recently started smoking a pen with marijuana oils in it.               Review of Systems   Constitutional: Negative for fatigue and fever.   HENT: Negative for congestion, dental problem, drooling, facial swelling, postnasal drip, rhinorrhea, sore throat, trouble swallowing and voice change.    Eyes: Negative for photophobia and visual disturbance.   Respiratory: Negative for cough, choking and shortness of breath.    Cardiovascular: Negative for chest pain and palpitations.   Gastrointestinal: Negative for abdominal pain, nausea and vomiting.   Genitourinary: Negative for flank pain.   Musculoskeletal: Negative for back pain and myalgias.   Neurological: Negative for syncope and weakness.   Psychiatric/Behavioral: Negative for confusion.           History:   Past Medical History:  Past Medical History:   Diagnosis Date   . Arthritis    . Diabetes mellitus, type 2 (CMS HCC)    . Gout    . HTN (hypertension)    . Sleep apnea        Past Surgical History:  No past surgical history on file.    Social History:  Social History     Tobacco Use   . Smoking status: Former Research scientist (life sciences)   . Smokeless tobacco: Former Chief Strategy Officer Use Topics   . Alcohol use: No   . Drug use: Yes     Types: Marijuana     Social History     Substance and Sexual Activity   Drug Use Yes   . Types: Marijuana       Family History:  Family History   Problem Relation Age of Onset   . Hypertension (High Blood Pressure) Mother    . Diabetes Father    . Diabetes Brother                Physical Exam  Vitals and nursing note reviewed.   Constitutional:       Appearance: Normal appearance.   HENT:      Head: Normocephalic and atraumatic.      Nose: Nose  normal.      Mouth/Throat:      Comments: Mildly enlarged uvula, no peritonsillar edema or masses noted. Normal post oropharynx.  Eyes:      Conjunctiva/sclera: Conjunctivae normal.      Pupils: Pupils are equal, round, and reactive to light.   Neck:      Comments: No palpable lymphadenopathy  Cardiovascular:      Rate and Rhythm: Normal rate and regular rhythm.      Heart sounds: Normal heart sounds.   Pulmonary:      Effort: Pulmonary effort is normal.      Breath sounds: Normal breath sounds.   Abdominal:      General: Abdomen is flat. There is no distension.      Tenderness: There is no abdominal tenderness. There is no guarding.   Musculoskeletal:         General: No swelling, tenderness or deformity. Normal range of motion.      Cervical back: Normal range of motion. No rigidity.   Neurological:  General: No focal deficit present.      Mental Status: He is alert and oriented to person, place, and time.            Course      ED Triage Vitals [04/27/19 1552]   BP (Non-Invasive) (!) 157/90   Heart Rate 93   Respiratory Rate 18   Temperature 36.8 C (98.2 F)   SpO2 97 %   Weight 130 kg (286 lb 9.6 oz)   Height 1.829 m (6')         Course/MDM      Orders Placed This Encounter   . STREPTOCOCCUS PYOGENES, PCR   . predniSONE (DELTASONE) 50 mg Oral Tablet       Labs Reviewed   STREPTOCOCCUS PYOGENES, PCR - Normal     All labs, EKGs and radiographical studies were reviewed.       VSS, no airway compromise. No signs of angioedema. Sx's stable x 1 week. Likely secondary to vaping marijuana that pt is doing. Rx steroids. Given strict ED return precautions if sx's get worse.           Encounter Diagnosis   Name Primary?   Marland Kitchen Uvulitis Yes         Discharged    Parts of this patient's chart may be completed in a retrospective fashion due to simultaneous direct patient care activities in the Emergency Department.   This note was partially generated using MModal Fluency Direct system, and there may be some incorrect words,  spellings, and punctuation that were not noted in checking the note before saving.      Toney Rakes, PA-C  04/27/2019, 16:01  The co-signing faculty was physically present in [place] and available for consultation and did not particpate in the care of this patient.@  Toney Rakes, PA-C  04/27/2019, 16:01

## 2019-05-11 ENCOUNTER — Ambulatory Visit (INDEPENDENT_AMBULATORY_CARE_PROVIDER_SITE_OTHER): Payer: Self-pay | Admitting: Otolaryngology

## 2019-06-29 ENCOUNTER — Inpatient Hospital Stay (HOSPITAL_COMMUNITY)
Admission: EM | Admit: 2019-06-29 | Discharge: 2019-06-29 | Disposition: A | Discharge status: Home / Self Care | Payer: 59 | Source: Other Acute Inpatient Hospital

## 2019-06-29 DIAGNOSIS — E1165 Type 2 diabetes mellitus with hyperglycemia: Secondary | ICD-10-CM

## 2019-06-29 DIAGNOSIS — M62838 Other muscle spasm: Secondary | ICD-10-CM

## 2019-06-29 DIAGNOSIS — M452 Ankylosing spondylitis of cervical region: Secondary | ICD-10-CM

## 2019-07-13 ENCOUNTER — Encounter (INDEPENDENT_AMBULATORY_CARE_PROVIDER_SITE_OTHER): Payer: Self-pay | Admitting: Otolaryngology

## 2019-07-13 ENCOUNTER — Other Ambulatory Visit: Payer: Self-pay

## 2019-07-13 ENCOUNTER — Ambulatory Visit (INDEPENDENT_AMBULATORY_CARE_PROVIDER_SITE_OTHER): Payer: 59 | Admitting: Otolaryngology

## 2019-07-13 VITALS — BP 143/88 | HR 97 | Temp 97.2°F | Ht 73.0 in | Wt 279.6 lb

## 2019-07-13 DIAGNOSIS — K1370 Unspecified lesions of oral mucosa: Secondary | ICD-10-CM

## 2019-07-13 NOTE — Progress Notes (Signed)
ENT, [place]  [place] [address]  [phone]    PATIENT NAME:  Austin Richmond  MRN:  [id number]  DOB:  12-09-1970  DATE OF SERVICE: 07/13/2019    Chief Complaint:  Arvid Right Swelling      HPI:  Austin Richmond is a 49 y.o. male presenting as a new patient for evaluation of uvula swelling. Patient denies pain but endorses irritation when he is able to feel it. He does take Eliquis.     Past Medical History:  Past Medical History:   Diagnosis Date   . Arthritis    . Diabetes mellitus, type 2 (CMS HCC)    . Gout    . HTN (hypertension)    . Sleep apnea          Past Surgical History:  History reviewed. No pertinent surgical history.    Family History:  Family Medical History:     Problem Relation (Age of Onset)    Diabetes Father, Brother    Hypertension (High Blood Pressure) Mother            Social History:  Social History     Tobacco Use   Smoking Status Former Smoker   Smokeless Tobacco Former Systems developer     Social History     Substance and Sexual Activity   Alcohol Use No     Social History     Occupational History   . Not on file       Medications:  Outpatient Medications Marked as Taking for the 07/13/19 encounter (Office Visit) with Concha Norway, MD   Medication Sig   . AMLODIPINE BESYLATE (NORVASC ORAL) Take by mouth   . apixaban (ELIQUIS) 5 mg Oral Tablet Take 5 mg by mouth Twice daily   . aspirin 81 mg Oral Tablet, Chewable Take 81 mg by mouth Once a day   . ertugliflozin pidolate (STEGLATRO ORAL) Take by mouth   . lisinopril (PRINIVIL) 20 mg Oral Tablet Take 20 mg by mouth Once a day   . metformin HCl (METFORMIN ORAL) Take by mouth   . potassium chloride (K-DUR) 10 mEq Oral Tab Sust.Rel. Particle/Crystal Take 10 mEq by mouth Once a day   . potassium chloride (K-DUR) 20 mEq Oral Tab Sust.Rel. Particle/Crystal Take 20 mEq by mouth Once a day       Allergies:  No Known Allergies    Review of Systems:  Do you have any fevers: no   Any weight change: no   Change  in your vision: yes    Chest Pain: no   Shortness of Breath: no   Stomach pain: no   Urinary difficulity: no   Joint Pain: no   Skin Problems: no   Weakness or Numbness: no   Easy Bruising or Bleeding: no   Excessive Thirst: no   Seasonal Allergies: no    All other systems reviewed and found to be negative.    Physical Exam:  Blood pressure (!) 143/88, pulse 97, temperature 36.2 C (97.2 F), temperature source Thermal Scan, height 1.854 m (6\' 1" ), weight 127 kg (279 lb 9.6 oz).  Body mass index is 36.89 kg/m.  General Appearance: Pleasant, cooperative, healthy, and in no acute distress.  Eyes: Conjunctivae/corneas clear, PERRLA, EOM's intact.  Head and Face: Normocephalic, atraumatic.  Face symmetric, no obvious lesions.   Right Ear:       Pinnae: Normal shape and position.        External  auditory canals:  Patent without inflammation.       Tympanic membranes:  Intact, translucent, midposition, middle ear aerated.  Left Ear:       Pinnae: Normal shape and position.        External auditory canals:  Patent without inflammation.       Tympanic membranes:  Intact, translucent, midposition, middle ear aerated.  Nose: External pyramid midline. Mucosa normal. No purulence, polyps, or crusts.   Oral Cavity/Oropharynx: Uvular lesion.  Tonsils: Deferred.  Nasopharynx: Deferred.  Hypopharynx/Larynx: Deferred.  Neck: no cervical adenopathy, no palpable thyroid or salivary gland masses  Thyroid: no significant thyroid abnormality by palpation  Cardiovascular: Good perfusion of upper extremities.  No cyanosis of the hands or fingers.  Lungs: No apparent stridorous breathing. No acute distress.  Skin: Skin warm and dry.  Neurologic: Cranial nerves:  grossly intact.  Psychiatric: Alert and oriented x 3.    Data Reviewed:  N/A    Assessment:  1. Uvular lesion.     Plan:  1. Patient will undergo excision of uvular lesion at [facility]. This procedure has been fully reviewed with the patient, and written informed consent  has been obtained.  2. Stop Eliquis 1 week prior to procedure.  3. F/U postoperatively or reevaluation.    Concha Norway, MD  Department of Otolaryngology  [facility]    PCP: Pcp Not In System  No address on file   REF: No referring provider defined for this encounter.     I am scribing for, and in the presence of, Dr. Whitney Post for services provided on 07/13/2019.  Roxana Hires, SCRIBE  Roxana Hires, [place]  07/13/2019, 15:24    I personally performed the services described in this documentation, as scribed  in my presence, and it is both accurate  and complete.    Concha Norway, MD

## 2019-08-09 ENCOUNTER — Encounter (HOSPITAL_COMMUNITY): Payer: Self-pay | Admitting: Otolaryngology

## 2019-08-09 NOTE — Care Management Notes (Signed)
Procedure (Excision of Oropharynx Lesion-Uvula) will be done by Dr. Whitney Post.  Case will be done as an outpatient.  CPT Codes [phone] or [phone] are not on the 05/11/19 Inpatient Only List.  Codes are valid, billable and do not require authorization if done as an outpatient. Reference number for [facility] Chat is [id number].  [facility], CASE MANAGER  08/09/2019, 13:46

## 2019-08-11 ENCOUNTER — Encounter (HOSPITAL_COMMUNITY): Admission: RE | Payer: Self-pay | Source: Ambulatory Visit

## 2019-08-11 ENCOUNTER — Ambulatory Visit (HOSPITAL_COMMUNITY): Admission: RE | Admit: 2019-08-11 | Payer: 59 | Source: Ambulatory Visit | Admitting: Otolaryngology

## 2019-08-11 HISTORY — DX: Acute embolism and thrombosis of unspecified deep veins of unspecified lower extremity: I82.409

## 2019-08-11 HISTORY — DX: Other general symptoms and signs: R68.89

## 2019-08-11 HISTORY — DX: Type 2 diabetes mellitus without complications: E11.9

## 2019-08-11 HISTORY — DX: Hyperlipidemia, unspecified: E78.5

## 2019-08-11 HISTORY — DX: Personal history of other specified conditions: Z87.898

## 2019-08-11 SURGERY — EXCISION LESION
Anesthesia: Monitor Anesthesia Care

## 2019-08-13 ENCOUNTER — Encounter (HOSPITAL_COMMUNITY): Payer: Self-pay | Admitting: Otolaryngology

## 2019-08-17 ENCOUNTER — Encounter (INDEPENDENT_AMBULATORY_CARE_PROVIDER_SITE_OTHER): Payer: Self-pay | Admitting: Otolaryngology

## 2019-08-18 ENCOUNTER — Encounter (HOSPITAL_COMMUNITY): Payer: Self-pay | Admitting: Otolaryngology

## 2019-08-18 ENCOUNTER — Ambulatory Visit (HOSPITAL_COMMUNITY): Payer: 59 | Admitting: Anesthesiology

## 2019-08-18 ENCOUNTER — Ambulatory Visit (HOSPITAL_COMMUNITY): Payer: 59 | Admitting: Otolaryngology

## 2019-08-18 ENCOUNTER — Other Ambulatory Visit: Payer: Self-pay

## 2019-08-18 ENCOUNTER — Ambulatory Visit
Admission: RE | Admit: 2019-08-18 | Discharge: 2019-08-18 | Disposition: A | Discharge status: Home / Self Care | Payer: 59 | Source: Ambulatory Visit | Attending: Otolaryngology | Admitting: Otolaryngology

## 2019-08-18 ENCOUNTER — Encounter (HOSPITAL_COMMUNITY)
Admission: RE | Disposition: A | Discharge status: Home / Self Care | Payer: Self-pay | Source: Ambulatory Visit | Attending: Otolaryngology

## 2019-08-18 DIAGNOSIS — K1379 Other lesions of oral mucosa: Secondary | ICD-10-CM | POA: Insufficient documentation

## 2019-08-18 HISTORY — DX: Dependence on other enabling machines and devices: Z99.89

## 2019-08-18 LAB — POC BLOOD GLUCOSE (RESULTS)
GLUCOSE, POC: 137 mg/dL — ABNORMAL HIGH (ref 70–100)
GLUCOSE, POC: 134 mg/dl — ABNORMAL HIGH (ref 70–100)

## 2019-08-18 SURGERY — EXCISION LESION
Anesthesia: Monitor Anesthesia Care | Site: Mouth | Wound class: Clean Contaminated Wounds-The respiratory, GI, Genital, or urinary

## 2019-08-18 MED ORDER — ONDANSETRON HCL (PF) 4 MG/2 ML INJECTION SOLUTION
Freq: Once | INTRAMUSCULAR | Status: DC | PRN
Start: 2019-08-18 — End: 2019-08-18
  Administered 2019-08-18: 4 mg via INTRAVENOUS

## 2019-08-18 MED ORDER — EPINEPHRINE 1 MG/ML (1 ML) INJECTION SOLUTION
INTRAMUSCULAR | Status: AC
Start: 2019-08-18 — End: 2019-08-18
  Filled 2019-08-18: qty 1

## 2019-08-18 MED ORDER — FENTANYL (PF) 50 MCG/ML INJECTION SOLUTION
Freq: Once | INTRAMUSCULAR | Status: DC | PRN
Start: 2019-08-18 — End: 2019-08-18
  Administered 2019-08-18: 100 ug via INTRAVENOUS

## 2019-08-18 MED ORDER — GLYCOPYRROLATE 0.2 MG/ML INJECTION SOLUTION
Freq: Once | INTRAMUSCULAR | Status: DC | PRN
Start: 2019-08-18 — End: 2019-08-18
  Administered 2019-08-18: .1 mg via INTRAVENOUS

## 2019-08-18 MED ORDER — SODIUM CHLORIDE 0.9 % (FLUSH) INJECTION SYRINGE
3.0000 mL | INJECTION | Freq: Three times a day (TID) | INTRAMUSCULAR | Status: DC
Start: 2019-08-18 — End: 2019-08-18

## 2019-08-18 MED ORDER — PROPOFOL 10 MG/ML INTRAVENOUS EMULSION
INTRAVENOUS | Status: DC | PRN
Start: 2019-08-18 — End: 2019-08-18
  Administered 2019-08-18: 0 ug/kg/min via INTRAVENOUS
  Administered 2019-08-18: 100 ug/kg/min via INTRAVENOUS

## 2019-08-18 MED ORDER — MIDAZOLAM (PF) 1 MG/ML INJECTION SOLUTION
Freq: Once | INTRAMUSCULAR | Status: DC | PRN
Start: 2019-08-18 — End: 2019-08-18
  Administered 2019-08-18: 2 mg via INTRAVENOUS

## 2019-08-18 MED ORDER — LIDOCAINE (PF) 100 MG/5 ML (2 %) INTRAVENOUS SYRINGE
INJECTION | Freq: Once | INTRAVENOUS | Status: DC | PRN
Start: 2019-08-18 — End: 2019-08-18
  Administered 2019-08-18: 50 mg via INTRAVENOUS

## 2019-08-18 MED ORDER — SODIUM CHLORIDE 0.9 % INJECTION SOLUTION
INTRAMUSCULAR | Status: AC
Start: 2019-08-18 — End: 2019-08-18
  Filled 2019-08-18: qty 10

## 2019-08-18 MED ORDER — SODIUM CHLORIDE 0.9 % (FLUSH) INJECTION SYRINGE
3.0000 mL | INJECTION | INTRAMUSCULAR | Status: DC | PRN
Start: 2019-08-18 — End: 2019-08-18

## 2019-08-18 MED ORDER — LACTATED RINGERS INTRAVENOUS SOLUTION
INTRAVENOUS | Status: DC
Start: 2019-08-18 — End: 2019-08-18
  Administered 2019-08-18 (×2): 0 via INTRAVENOUS
  Administered 2019-08-18: 0 mL via INTRAVENOUS

## 2019-08-18 MED ORDER — FUROSEMIDE 10 MG/ML INJECTION SOLUTION
Freq: Once | INTRAMUSCULAR | Status: DC | PRN
Start: 2019-08-18 — End: 2019-08-18
  Administered 2019-08-18: 10 mg via INTRAVENOUS

## 2019-08-18 MED ORDER — SODIUM CHLORIDE 0.9 % INTRAVENOUS SOLUTION
Freq: Once | INTRAVENOUS | Status: DC | PRN
Start: 2019-08-18 — End: 2019-08-18
  Administered 2019-08-18: 2 mL via TOPICAL

## 2019-08-18 MED ORDER — DEXAMETHASONE SODIUM PHOSPHATE 4 MG/ML INJECTION SOLUTION
Freq: Once | INTRAMUSCULAR | Status: DC | PRN
Start: 2019-08-18 — End: 2019-08-18
  Administered 2019-08-18: 8 mg via INTRAVENOUS

## 2019-08-18 MED ORDER — KETAMINE 50 MG/ML INJECTION SOLUTION
Freq: Once | INTRAMUSCULAR | Status: DC | PRN
Start: 2019-08-18 — End: 2019-08-18
  Administered 2019-08-18 (×2): 50 mg via INTRAVENOUS

## 2019-08-18 MED ORDER — LIDOCAINE HCL 4 % (40 MG/ML) MUCOSAL SOLUTION
Freq: Once | Status: DC | PRN
Start: 2019-08-18 — End: 2019-08-18
  Administered 2019-08-18: 3 mL

## 2019-08-18 MED ORDER — SODIUM CHLORIDE 0.9 % INJECTION SOLUTION
Freq: Once | INTRAMUSCULAR | Status: DC | PRN
Start: 2019-08-18 — End: 2019-08-18
  Administered 2019-08-18: 10 mL via INTRAVENOUS

## 2019-08-18 SURGICAL SUPPLY — 34 items
BLADE SURGICAL KNIFE #15 STR BLADE #15 150/BX (SURGICAL CUTTING SUPPLIES) IMPLANT
CANISTER 2000 SUCT BLUE TOP FLUSHABLE (SUPP) ×1 IMPLANT
CONT 2600SA OR SPEC WRAP CS/100 Cont. MUST be sterile (SUPP) IMPLANT
DEPRESSOR TONGUE 6STR 100/BX GUE WD (SUPP) IMPLANT
DRAPE 89023 MED W TAPE last order 06/24/2016 (DRAPE/PACKS/SHEETS/OR TOWEL) ×2 IMPLANT
DRESSING SPONGE 4X4 12PLY 10PK GAZ SPNG 4X4 12PLY 10 EA/PK (WOUND CARE SUPPLY) IMPLANT
DRESSING SPONGE 4X4 12PLY25/BX DRESSING SPONGE 4X4 (WOUND CARE SUPPLY) IMPLANT
DRESSING SPONGE 4X4 12PLY25/BX DRESSING SPONGE 4X4 (WOUND CARE/ENTEROSTOMAL SUPPLY)
DRESSING SPONGE XRAY 8X4 7318 GAUZE SPONGE STERILE 10/TRAY (SUPP) ×1 IMPLANT
ELECTRODE 88000M13C BLADE ELECTRODE 88000M13C BLADE (SUPP) IMPLANT
ELECTRODE GROUNDING PAD 9165 PAD ELECTROSURGICAL GROUNDING (SUPP) IMPLANT
ELECTRODE NEEDLE 88000012 ELECTRODE NEEDLE 88000012 (SUPP) IMPLANT
GLOVE HYDRASOFT SZ 8 50/BX GLOVE HYDRASOFT SZ 8 50/BX (SUPP) ×2 IMPLANT
GOWN DYNJP2402 ULTRA X-LG GOWN DYNJP2402ULTRA X-LG 30/CS (GOWN) ×2 IMPLANT
NEEDLE 23G 1 1/2 IM 5194 TW SNGL SS POLYPROP REG BVL LL (SUPP) ×1 IMPLANT
NEEDLE HYPO 27GX1 1/4 100/BX NEEDLE HYPODRMC 27GX 100/BX (SUPP) ×2 IMPLANT
PACK DYNJ30186F BASIC SET UP PACK BASIC SETUP (CUSTOM TRAYS & PACK) ×1 IMPLANT
PEN CAUTERY SURG ACCU-TEM PEN CATRY SURG ACUTP WAS AA03 (SUPP) ×1 IMPLANT
PENCIL BOVIE HANDSWITCH PENCIL BOVIE HANDSWITCH (SUPP) IMPLANT
POSITIONER HEAD RING ROUND POSITIONER HEAD RING ROUND (MED) ×1 IMPLANT
PROTECTOR NERVE ULNAR VM9500E wasPROTECTOR NERVE ULNARVM9500 (MED) IMPLANT
SHEET DYNJP700 SPLIT ESSENTIAL SHEET DYNJP700 SPLIT ESSENTIA (DRAPE/PACKS/SHEETS/OR TOWEL) ×1 IMPLANT
SOL SOD CHL.9 500ML BTL 613803 SOL SOD CHL.9 500ML BTL 61380 (IRRG) ×1 IMPLANT
SOL WATER 1500ML BOTTLE SOL WATER 1500ML PB (IRRG) ×2 IMPLANT
STERI-STRIP 1/2 R1547 R1547 6/PK 50PK/BX (WOUND CARE SUPPLY) IMPLANT
SUTURE 685G 20 SILK 12/BX 8IN BLK BRD NONAB (SUTURE/WOUND CLOSURE) IMPLANT
SUTURE J496H 40 VICRYL 36/BX N UNDYED BRD COAT ABS (SUTURE/WOUND CLOSURE) IMPLANT
SUTURE L113G 20 CHROMIC 12/BX INACT 11/21 FOR WORKDAY (SUTURE/WOUND CLOSURE) IMPLANT
SUTURE Y834G 50 MONCRYL 18IN UNDYED MONOF ABS (SUTURE/WOUND CLOSURE) IMPLANT
SYRINGE 10CC CNTRL 309695 STERILE LATEX FREE DISPOSABLE (SUPP) ×1 IMPLANT
SYRINGE BULB 2PC IRRIG BULB11P SYRINGE BULB GENT-L-KARE POLY (SUPP) IMPLANT
TRANSFER DEVICE IV FLUID DECANTER FLUID TRANSFER DEVICE (SUPP) ×1 IMPLANT
TUBE 8888501007 SUCTION YANKR SMTH INR LUM FLXB REG CPC (SUPP) IMPLANT
TUBING SUCTION CONNECT10FT TUBING SUCTION CONNECT 10ft (SUPP) ×1 IMPLANT

## 2019-08-18 NOTE — OR PostOp (Signed)
No bloody drainage noted in mouth.

## 2019-08-18 NOTE — Anesthesia Preprocedure Evaluation (Signed)
ANESTHESIA PRE-OP EVALUATION  Planned Procedure: LESION EXCISION OROPHARYNX (UVULA) (N/A )  Review of Systems                   Pulmonary   sleep apnea,   Cardiovascular    Hypertension ,No peripheral edema,        GI/Hepatic/Renal        Endo/Other      type 2 diabetes     Neuro/Psych/MS        Cancer                   Physical Assessment      Airway       Mallampati: I    TM distance: >3 FB    Neck ROM: full  Mouth Opening: good.      No endotracheal tube present      Dental                    Pulmonary    Breath sounds clear to auscultation  (-) no rhonchi, no decreased breath sounds, no wheezes, no rales and no stridor     Cardiovascular    Rhythm: regular  Rate: Normal  (-) no friction rub, carotid bruit is not present, no peripheral edema and no murmur     Other findings            Plan  ASA 3     Planned anesthesia type: MAC              Intravenous induction               Patient's NPO status is appropriate for Anesthesia.

## 2019-08-18 NOTE — OR PostOp (Signed)
Scant bloody drainage noted above uvula.

## 2019-08-18 NOTE — Anesthesia Transfer of Care (Signed)
ANESTHESIA TRANSFER OF CARE   Austin Richmond is a 49 y.o. ,male, Weight: 132 kg (292 lb)   had Procedure(s):  LESION EXCISION OROPHARYNX (UVULA)  performed  08/18/19   Primary Service: Concha Norway, MD    Past Medical History:   Diagnosis Date   . Arthritis    . CPAP (continuous positive airway pressure) dependence    . Deep vein thrombosis (DVT) (CMS HCC)     left leg   . Diabetes mellitus, type 2 (CMS HCC)    . Gout    . History of anesthesia complications     pt never had anesthesia   . HTN (hypertension)    . Hyperlipidemia    . Neck problem     arthritis neck   . Sleep apnea    . Type 2 diabetes mellitus (CMS HCC)       Allergy History as of 08/18/19     LEVONORGESTREL-ETHINYL ESTRAD       Noted Status Severity Type Reaction    08/18/19 1700 [place], Michelle J, RN 08/18/19 Active Low Side Effect               I completed my transfer of care / handoff to the receiving personnel during which we discussed:  Access, Airway, All key/critical aspects of case discussed, Analgesia, Antibiotics, Expectation of post procedure, Fluids/Product, Gave opportunity for questions and acknowledgement of understanding, Labs and PMHx    Post Location: Phase II                                       Report given to: Berneice Heinrich, RN                           Last OR Temp: Temperature: 36 C (96.8 F)  ABG:  POTASSIUM   Date Value Ref Range Status   10/15/2015 3.6 3.5 - 5.1 mmol/L Final     KETONES   Date Value Ref Range Status   08/02/2015 Negative Negative mg/dL Final     CALCIUM   Date Value Ref Range Status   10/15/2015 9.0 8.5 - 10.2 mg/dL Final     Calculated P Axis   Date Value Ref Range Status   10/15/2015 22 degrees Final     Calculated R Axis   Date Value Ref Range Status   10/15/2015 -28 degrees Final     Airway:* No LDAs found *  Blood pressure (!) 143/84, pulse 80, temperature 36 C (96.8 F), resp. rate 20, height 1.829 m (6'), weight 132 kg (292 lb), SpO2 96 %.

## 2019-08-18 NOTE — Discharge Instructions (Signed)
SURGICAL DISCHARGE INSTRUCTIONS     Dr. Concha Norway, MD  performed your LESION EXCISION OROPHARYNX ([place]) today at the [facility] AS DISCUSSED BY YOUR NURSE:  Austin Richmond    SIGNS AND SYMPTOMS OF A WOUND / INCISION INFECTION   Be sure to watch for the following:   Increase in redness or red streaks near or around the wound or incision.   Increase in pain that is intense or severe and cannot be relieved by the pain medication that your doctor has given you.   Increase in swelling that cannot be relieved by elevation of a body part, or by applying ice, if permitted.   Increase in drainage, or if yellow / green in color and smells bad. This could be on a dressing or a cast.   Increase in fever for longer than 24 hours, or an increase that is higher than 101 degrees Fahrenheit (normal body temperature is 98 degrees Fahrenheit). The incision may feel warm to the touch.    **CALL YOUR DOCTOR IF ONE OR MORE OF THESE SIGNS / SYMPTOMS SHOULD OCCUR.    ANESTHESIA INFORMATION   ANESTHESIA -- ADULT PATIENTS:  You have received intravenous sedation / general anesthesia, and you may feel drowsy and light-headed for several hours. You may even experience some forgetfulness of the procedure. DO NOT DRIVE A MOTOR VEHICLE or perform any activity requiring complete alertness or coordination until you feel fully awake in about 24-48 hours. Do not drink alcoholic beverages for at least 24 hours. Do not stay alone, you must have a responsible adult available to be with you. You may also experience a dry mouth or nausea for 24 hours. This is a normal side effect and will disappear as the effects of the medication wear off.    REMEMBER   If you experience any difficulty breathing, chest pain, bleeding that you feel is excessive, persistent nausea or vomiting or for any other concerns:  Call your physician Dr.  Concha Norway, MD   at [phone] or [phone]  You may also ask to have the general doctor on call paged. They are available to you 24 hours a day.    SPECIAL INSTRUCTIONS / COMMENTS       FOLLOW-UP APPOINTMENTS   Please call your surgeon's office at the number listed about  to schedule a date / time of return.

## 2019-08-18 NOTE — H&P (Signed)
[facility]  [place] [address]  [phone]    PATIENT NAME:  Austin Richmond  MRN:  [id number]  DOB:  November 13, 1970  DATE OF SERVICE: 08/18/2019    Chief Complaint:  Arvid Right lesion    HPI:  Austin Richmond is a 49 y.o. male presenting for excision of uvula lesion. The patient is stable since his last encounter.    Past Medical History:  Past Medical History:   Diagnosis Date   . Arthritis    . CPAP (continuous positive airway pressure) dependence    . Deep vein thrombosis (DVT) (CMS HCC)     left leg   . Diabetes mellitus, type 2 (CMS HCC)    . Gout    . History of anesthesia complications     pt never had anesthesia   . HTN (hypertension)    . Hyperlipidemia    . Neck problem     arthritis neck   . Sleep apnea    . Type 2 diabetes mellitus (CMS HCC)          Past Surgical History:  negative      Family History:  Family Medical History:     Problem Relation (Age of Onset)    Diabetes Father, Brother    Hypertension (High Blood Pressure) Mother            Social History:  Social History     Tobacco Use   Smoking Status Never Smoker   Smokeless Tobacco Never Used     Social History     Substance and Sexual Activity   Alcohol Use No         Allergies:  Allergies   Allergen Reactions   . Seasonale [Levonorgestrel-Ethinyl Estrad]        Review of Systems:                                                        All other systems reviewed and found to be negative.    Physical Exam:  Blood pressure (!) 157/93, pulse 76, temperature 36.3 C (97.3 F), resp. rate 18, height 1.829 m (6'), weight 132 kg (292 lb), SpO2 97 %.  Body mass index is 39.6 kg/m.  General Appearance: Pleasant, cooperative, healthy, and in no acute distress.  Eyes: Conjunctivae/corneas clear, PERRLA, EOM's intact.  Head and Face: Normocephalic, atraumatic.  Face symmetric, no obvious lesions.   Right Ear:       Pinnae: Normal shape and position.        External auditory canals:  Patent without inflammation.        Tympanic membranes:  Intact, translucent, midposition, middle ear aerated.  Left Ear:       Pinnae: Normal shape and position.        External auditory canals:  Patent without inflammation.       Tympanic membranes:  Intact, translucent, midposition, middle ear aerated.  Nose: External pyramid midline. Mucosa normal. No purulence, polyps, or crusts.   Oral Cavity/Oropharynx: No mucosal lesions, masses, or pharyngeal asymmetry. With uvula lesion  Tonsils: Deferred.  Nasopharynx: Deferred.  Hypopharynx/Larynx: Deferred.  Neck: no cervical adenopathy, no palpable thyroid or salivary gland masses  Thyroid: no significant thyroid abnormality by palpation  Cardiovascular: Good perfusion of upper extremities.  No  cyanosis of the hands or fingers.  Lungs: No apparent stridorous breathing. No acute distress.  Skin: Skin warm and dry.  Neurologic: Cranial nerves:  grossly intact.  Psychiatric: Alert and oriented x 3.      Data Reviewed:  N/A    Assessment:  1. Uvula lesion    Plan:  Orders Placed This Encounter   . PERFORM POC WHOLE BLOOD GLUCOSE   . INSERT & MAINTAIN PERIPHERAL IV ACCESS   . PERIPHERAL IV DRESSING CHANGE   . NS flush syringe   . NS flush syringe   . LR premix infusion   1. excision today    Concha Norway, MD  Department of [facility]    PCP: Pcp Not In System  No address on file   REF: No referring provider defined for this encounter.     Concha Norway, MD  08/18/2019, 09:45

## 2019-08-18 NOTE — Anesthesia Postprocedure Evaluation (Signed)
Anesthesia Post Op Evaluation    Patient: Austin Richmond  Procedure(s):  LESION EXCISION OROPHARYNX (UVULA)    Last Vitals:Temperature: 36 C (96.8 F) (08/18/19 1053)  Heart Rate: 80 (08/18/19 1053)  BP (Non-Invasive): (!) 148/91 (08/18/19 1130)  Respiratory Rate: 20 (08/18/19 1053)  SpO2: 90 % (08/18/19 2957)    No complications documented.    Patient is sufficiently recovered from the effects of anesthesia to participate in the evaluation and has returned to their pre-procedure level.  Patient location during evaluation: PACU       Patient participation: complete - patient participated  Level of consciousness: awake and alert and responsive to verbal stimuli    Pain score: 2  Pain management: adequate  Airway patency: patent    Anesthetic complications: no  Cardiovascular status: acceptable  Respiratory status: acceptable  Hydration status: acceptable  Patient post-procedure temperature: Pt Normothermic   PONV Status: Absent

## 2019-08-18 NOTE — OR Surgeon (Addendum)
[facility]   OPERATIVE NOTE    Name: Austin Richmond, 49 y.o. male  MRN: [id number]  DOB: 07-Jun-1970  Date of Surgery: 08/18/2019    Preoperative Diagnoses:  1. Uvula Lesion    Postoperative Diagnoses:   Same    Procedure:   1. Excisional Biopsy of Uvula lesion    Surgeons: Concha Norway, MD  Anesthesia: MAC    Estimated Blood Loss: Minimal    Operative Findings:   1. Exophytic Uvula Lesion    Specimen: Uvula Lesion    Complications: None  Condition: Stable  Disposition: Please Discharge home when meets [facility]SC criteria.    Description of Procedure: After the patient and family were allowed to ask any remaining questions in the preoperative holding area, their questions were answered. The patient was then escorted back to the operating room by both ENT and anesthesia. Once in the operating room a surgical pause was conducted. The patient was then induced with MAC anesthesia. The patient was prepped and draped in the usual sterile fashion for an excisional biopsy/lesion.    At this point the dedo laryngoscope was placed and at that point a cup forceps was used to excise this uvula lesion in its entirety. An epinephrine pledget was used for hemostasis. The patient was turned over to anesthesia in stable condition.         PLEASE DISCHARGE PATIENT TO HOME POSTOP IF MEETS [place] CRITERIA.     Concha Norway, MD 08/18/2019, 10:47

## 2019-08-24 ENCOUNTER — Encounter (INDEPENDENT_AMBULATORY_CARE_PROVIDER_SITE_OTHER): Payer: 59 | Admitting: Otolaryngology

## 2019-08-30 LAB — SURGICAL PATHOLOGY SPECIMEN

## 2019-08-31 ENCOUNTER — Other Ambulatory Visit: Payer: Self-pay

## 2019-08-31 ENCOUNTER — Ambulatory Visit (INDEPENDENT_AMBULATORY_CARE_PROVIDER_SITE_OTHER): Payer: 59 | Admitting: Otolaryngology

## 2019-08-31 ENCOUNTER — Encounter (INDEPENDENT_AMBULATORY_CARE_PROVIDER_SITE_OTHER): Payer: Self-pay | Admitting: Otolaryngology

## 2019-08-31 VITALS — BP 146/80 | HR 69 | Temp 97.7°F | Ht 73.0 in | Wt 292.2 lb

## 2019-08-31 DIAGNOSIS — H6980 Other specified disorders of Eustachian tube, unspecified ear: Secondary | ICD-10-CM

## 2019-08-31 DIAGNOSIS — K1370 Unspecified lesions of oral mucosa: Secondary | ICD-10-CM

## 2019-08-31 DIAGNOSIS — H6983 Other specified disorders of Eustachian tube, bilateral: Secondary | ICD-10-CM

## 2019-08-31 MED ORDER — FLUTICASONE PROPIONATE 50 MCG/ACTUATION NASAL SPRAY,SUSPENSION
2.0000 | Freq: Every day | NASAL | 11 refills | Status: DC
Start: 2019-08-31 — End: 2023-10-22

## 2019-08-31 NOTE — Progress Notes (Signed)
ENT, [place]  [place] [address]  [phone]    PATIENT NAME:  Austin Richmond  MRN:  [id number]  DOB:  Mar 18, 1970  DATE OF SERVICE: 08/31/2019    Chief Complaint:  Follow-up    HPI:  Beryl Balz is a 49 y.o. male who is an established patient that has been seen in clinic within the last 3 years. Patient was last seen by me on 07/13/19 for evaluation of uvular swelling. At that time, patient was scheduled to undergo excision of uvular lesion at [facility] presents today for post op evaluation, s/p excisional biopsy of exophytic uvula lesion on 08/18/19 at [facility]. Patient reports no complaints regarding uvula. He does have complaints of constant ear popping. He does not use a nasal spray. Otherwise, patient is doing well and reports no other complaints today.    Review of Systems:                                                        All other systems reviewed and found to be negative except as per HPI.    Physical Exam:  Blood pressure (!) 146/80, pulse 69, temperature 36.5 C (97.7 F), temperature source Thermal Scan, height 1.854 m (6\' 1" ), weight 133 kg (292 lb 3.2 oz).  Body mass index is 38.55 kg/m.  General Appearance: Pleasant, cooperative, healthy, and in no acute distress.  Eyes: Conjunctivae/corneas clear, PERRLA, EOM's intact.  Head and Face: Normocephalic, atraumatic.  Face symmetric, no obvious lesions.   Right Ear:       Pinnae: Normal shape and position.        External auditory canals:  Patent without inflammation.       Tympanic membranes:  Intact, translucent, midposition, middle ear aerated.  Left Ear:       Pinnae: Normal shape and position.        External auditory canals:  Patent without inflammation.       Tympanic membranes:  Intact, translucent, midposition, middle ear aerated.  Nose: External pyramid midline. Mucosa normal. No purulence, polyps, or crusts.   Oral Cavity/Oropharynx: No mucosal lesions,  masses, or pharyngeal asymmetry.  Tonsils: Deferred.   Nasopharynx: Deferred.  Hypopharynx/Larynx: Deferred.  Neck: no cervical adenopathy, no palpable thyroid or salivary gland masses  Thyroid: no significant thyroid abnormality by palpation  Cardiovascular: Good perfusion of upper extremities.  No cyanosis of the hands or fingers.  Lungs: No apparent stridorous breathing. No acute distress.  Skin: Skin warm and dry.  Neurologic: Cranial nerves: grossly intact.  Psychiatric: Alert and oriented x 3.    Data Reviewed:    Pathology from 08/18/19 reviewed:  DIAGNOSIS: Lab:  Uvula (Excision):  IRRITATED AND INFLAMED SQUAMOUS PAPILLOMA.    Assessment:  1. S/p excisional biopsy of exophytic uvula lesion on 08/18/19   2. ETD, bilateral.    Plan:  Orders Placed This Encounter   . fluticasone propionate (FLONASE) 50 mcg/actuation Nasal Spray, Suspension     1. Prescribed Flonase, 2 sprays in each nostril daily, for ETD.  2. F/U prn for reevaluation.  3. I did advise the patient that he had benign pathology and he can follow up as needed for exam of his oropharynx    Concha Norway, MD  Department of Otolaryngology  [facility]    PCP: Pcp Not In System  No address on file   REF: No referring provider defined for this encounter.     I am scribing for, and in the presence of, Dr. Whitney Post for services provided on 08/31/2019.  Roxana Hires, SCRIBE  Roxana Hires, [place]  08/31/2019, 13:56    I personally performed the services described in this documentation, as scribed  in my presence, and it is both accurate  and complete.    Concha Norway, MD

## 2019-10-13 ENCOUNTER — Ambulatory Visit: Payer: 59

## 2019-10-13 ENCOUNTER — Other Ambulatory Visit: Payer: Self-pay

## 2019-10-13 DIAGNOSIS — Z01818 Encounter for other preprocedural examination: Secondary | ICD-10-CM

## 2019-10-13 LAB — CBC WITH DIFF
BASOPHIL #: <0.1 10*3/uL (ref <=0.20)
BASOPHIL %: 1 %
EOSINOPHIL #: 0.12 10*3/uL (ref <=0.50)
EOSINOPHIL %: 2 %
HCT: 46.8 % (ref 38.9–52.0)
HGB: 15.4 g/dL (ref 13.4–17.5)
IMMATURE GRANULOCYTE #: <0.1 10*3/uL (ref <0.10)
IMMATURE GRANULOCYTE %: 0 % (ref 0–1)
LYMPHOCYTE #: 1.43 10*3/uL (ref 1.00–4.80)
LYMPHOCYTE %: 22 %
MCH: 28.7 pg (ref 26.0–32.0)
MCHC: 32.9 g/dL (ref 31.0–35.5)
MCV: 87.2 fL (ref 78.0–100.0)
MONOCYTE #: 0.58 10*3/uL (ref 0.20–1.10)
MONOCYTE %: 9 %
MPV: 10 fL (ref 8.7–12.5)
NEUTROPHIL #: 4.44 10*3/uL (ref 1.50–7.70)
NEUTROPHIL %: 66 %
PLATELETS: 291 10*3/uL (ref 150–400)
RBC: 5.37 10*6/uL (ref 4.50–6.10)
RDW-CV: 12.4 % (ref 11.5–15.5)
WBC: 6.6 10*3/uL (ref 3.7–11.0)

## 2019-11-26 ENCOUNTER — Other Ambulatory Visit (HOSPITAL_COMMUNITY): Payer: Self-pay | Admitting: Physician Assistant

## 2019-11-26 DIAGNOSIS — R590 Localized enlarged lymph nodes: Secondary | ICD-10-CM

## 2019-11-30 ENCOUNTER — Other Ambulatory Visit (HOSPITAL_BASED_OUTPATIENT_CLINIC_OR_DEPARTMENT_OTHER): Payer: Self-pay

## 2019-12-10 ENCOUNTER — Other Ambulatory Visit (HOSPITAL_BASED_OUTPATIENT_CLINIC_OR_DEPARTMENT_OTHER): Payer: Self-pay

## 2019-12-23 ENCOUNTER — Ambulatory Visit
Admission: RE | Admit: 2019-12-23 | Discharge: 2019-12-23 | Disposition: A | Discharge status: Home / Self Care | Payer: 59 | Source: Ambulatory Visit | Attending: Physician Assistant | Admitting: Physician Assistant

## 2019-12-23 ENCOUNTER — Other Ambulatory Visit: Payer: Self-pay

## 2019-12-23 ENCOUNTER — Other Ambulatory Visit (HOSPITAL_COMMUNITY): Payer: Self-pay | Admitting: Physician Assistant

## 2019-12-23 DIAGNOSIS — R590 Localized enlarged lymph nodes: Secondary | ICD-10-CM

## 2019-12-31 ENCOUNTER — Encounter (HOSPITAL_COMMUNITY): Payer: Self-pay

## 2020-01-07 ENCOUNTER — Other Ambulatory Visit: Payer: Self-pay

## 2020-01-07 ENCOUNTER — Ambulatory Visit (INDEPENDENT_AMBULATORY_CARE_PROVIDER_SITE_OTHER): Payer: 59 | Admitting: Otolaryngology

## 2020-01-07 VITALS — BP 149/87 | HR 71 | Temp 97.3°F | Ht 72.0 in | Wt 292.2 lb

## 2020-01-07 DIAGNOSIS — H6121 Impacted cerumen, right ear: Secondary | ICD-10-CM

## 2020-01-07 DIAGNOSIS — H6981 Other specified disorders of Eustachian tube, right ear: Secondary | ICD-10-CM

## 2020-01-07 DIAGNOSIS — H6691 Otitis media, unspecified, right ear: Secondary | ICD-10-CM

## 2020-01-07 DIAGNOSIS — H6690 Otitis media, unspecified, unspecified ear: Secondary | ICD-10-CM

## 2020-01-07 DIAGNOSIS — H6980 Other specified disorders of Eustachian tube, unspecified ear: Secondary | ICD-10-CM

## 2020-01-07 MED ORDER — AMOXICILLIN 875 MG-POTASSIUM CLAVULANATE 125 MG TABLET
1.00 | ORAL_TABLET | Freq: Two times a day (BID) | ORAL | 0 refills | Status: DC
Start: 2020-01-07 — End: 2020-03-01

## 2020-01-07 NOTE — Procedures (Signed)
ENT, [facility]  [place] [address]    Procedure Note    Name: Faiz Weber MRN:  [id number]   Date: 01/07/2020 Age: 49 y.o.       Ear Cerumen Removal    Date/Time: 01/07/2020 1:36 PM  Performed by: Concha Norway, MD  Authorized by: Concha Norway, MD     Consent:     Consent obtained:  Verbal    Consent given by:  Patient    Risks discussed:  Incomplete removal    Alternatives discussed:  No treatment  Procedure details:     Location:  R ear    Procedure type: curette    Post-procedure details:     Inspection:  TM intact    Hearing quality:  Improved    Patient tolerance of procedure:  Tolerated well, no immediate complications    Cerumen Removal:    Right EAC(s) examined under binocular microscopy. Tympanic membranes occluded. Impacted cerumen was cleaned from the canal(s) using curette and #7 suction.  Patient tolerated procedure well.  Underlying anatomy was normal.     I am scribing for, and in the presence of, Dr. Whitney Post for services provided on 01/07/2020.  Roxana Hires, SCRIBE   Roxana Hires, SCRIBE,  01/07/2020, 13:37.    .I personally performed the services described in this documentation, as scribed  in my presence, and it is both accurate  and complete.    Concha Norway, MD

## 2020-01-07 NOTE — Progress Notes (Signed)
[facility], [place]  [place] [address]  [phone]    PATIENT NAME:  Austin Richmond  MRN:  [id number]  DOB:  January 01, 1971  DATE OF SERVICE: 01/07/2020    Chief Complaint:  Ear Problem(s) (right ear clogged)    HPI:  Austin Richmond is a 49 y.o. male who is an established patient that has been seen in clinic within the last 3 years. Patient was last seen by me on 08/31/19 for post op evaluation, s/p excisional biopsy of exophytic uvula lesion on 08/18/19 at [facility]. At that time, patient had complaints of ear popping. Patient was prescribed Flonase for ETD.    Austin Richmond presents today for evaluation of right aural fullness. He endorses decreased hearing from the right ear as well. He notes illness prior to onset of aural fullness. He continues to use Flonase as prescribed. Otherwise, patient is doing well and reports no other complaints today. He denies recurrence of uvular lesion.    Review of Systems:                                                        All other systems reviewed and found to be negative except as per HPI.    Physical Exam:  Blood pressure (!) 149/87, pulse 71, temperature 36.3 C (97.3 F), height 1.829 m (6'), weight 133 kg (292 lb 3.2 oz).  Body mass index is 39.63 kg/m.  General Appearance: Pleasant, cooperative, healthy, and in no acute distress.  Eyes: Conjunctivae/corneas clear, PERRLA, EOM's intact.  Head and Face: Normocephalic, atraumatic.  Face symmetric, no obvious lesions.   Right Ear:       Pinnae: Normal shape and position.        External auditory canals: Impacted cerumen, removed with curette and suction.       Tympanic membranes:  Intact, translucent, midposition, middle ear effusion.  Left Ear:       Pinnae: Normal shape and position.        External auditory canals:  Patent without inflammation.       Tympanic membranes:  Intact, translucent, midposition, middle ear aerated.  Nose: External pyramid midline. Mucosa normal.  No purulence, polyps, or crusts.   Oral Cavity/Oropharynx: No mucosal lesions, masses, or pharyngeal asymmetry.  Tonsils: Deferred.   Nasopharynx: Deferred.  Hypopharynx/Larynx: Deferred.  Neck: no cervical adenopathy, no palpable thyroid or salivary gland masses  Thyroid: no significant thyroid abnormality by palpation  Cardiovascular: Good perfusion of upper extremities.  No cyanosis of the hands or fingers.  Lungs: No apparent stridorous breathing. No acute distress.  Skin: Skin warm and dry.  Neurologic: Cranial nerves: grossly intact.  Psychiatric: Alert and oriented x 3.    Procedure:  ENT, [facility]  [place] [address]    Procedure Note    Name: Austin Richmond MRN:  [id number]   Date: 01/07/2020 Age: 49 y.o.       Ear Cerumen Removal    Date/Time: 01/07/2020 1:36 PM  Performed by: Concha Norway, MD  Authorized by: Concha Norway, MD     Consent:     Consent obtained:  Verbal    Consent given by:  Patient    Risks discussed:  Incomplete removal    Alternatives discussed:  No treatment  Procedure details:     Location:  R ear    Procedure type: curette    Post-procedure details:     Inspection:  TM intact    Hearing quality:  Improved    Patient tolerance of procedure:  Tolerated well, no immediate complications    Cerumen Removal:    Right EAC(s) examined under binocular microscopy. Tympanic membranes occluded. Impacted cerumen was cleaned from the canal(s) using curette and #7 suction.  Patient tolerated procedure well.  Underlying anatomy was normal.     I am scribing for, and in the presence of, Dr. Whitney Post for services provided on 01/07/2020.  Roxana Hires, SCRIBE   Roxana Hires, SCRIBE,  01/07/2020, 13:37.    Roxana Hires, SCRIBE      Data Reviewed:  N/A    Assessment:  1. Cerumen impaction, right, cleared today in clinic.  2. Otitis media, right, worsening.  3. ETD, right.    Plan:  Orders Placed This Encounter   . Ear Cerumen Removal   . amoxicillin-pot  clavulanate (AUGMENTIN) 875-125 mg Oral Tablet     1. Cerumen impaction cleared today in clinic.  2. Prescribed Augmentin 875 mg for right otitis media.  3. Continue Flonase, 2 sprays in each nostril daily, for ETD.  4. Begin use of decongestant for ETD.  5. F/U in 3 weeks for reevaluation.    Concha Norway, MD  Department of Otolaryngology  [facility]    PCP: Pcp Not In System  No address on file   REF: No referring provider defined for this encounter.     I am scribing for, and in the presence of, Dr. Whitney Post for services provided on 01/07/2020.  Roxana Hires, SCRIBE  Roxana Hires, [place]  01/07/2020, 13:37  I personally performed the services described in this documentation, as scribed  in my presence, and it is both accurate  and complete.    Concha Norway, MD

## 2020-01-25 ENCOUNTER — Ambulatory Visit (INDEPENDENT_AMBULATORY_CARE_PROVIDER_SITE_OTHER): Payer: 59 | Admitting: Otolaryngology

## 2020-01-25 ENCOUNTER — Other Ambulatory Visit: Payer: Self-pay

## 2020-01-25 ENCOUNTER — Encounter (INDEPENDENT_AMBULATORY_CARE_PROVIDER_SITE_OTHER): Payer: Self-pay | Admitting: Otolaryngology

## 2020-01-25 VITALS — BP 146/78 | HR 71 | Temp 97.3°F | Ht 72.0 in | Wt 296.0 lb

## 2020-01-25 DIAGNOSIS — M26609 Unspecified temporomandibular joint disorder, unspecified side: Secondary | ICD-10-CM

## 2020-01-25 DIAGNOSIS — H6690 Otitis media, unspecified, unspecified ear: Secondary | ICD-10-CM

## 2020-01-25 DIAGNOSIS — J351 Hypertrophy of tonsils: Secondary | ICD-10-CM

## 2020-01-25 DIAGNOSIS — H6980 Other specified disorders of Eustachian tube, unspecified ear: Secondary | ICD-10-CM

## 2020-01-25 NOTE — Progress Notes (Signed)
ENT, [place]  [place] [address]  [phone]    PATIENT NAME:  Austin Richmond  MRN:  [id number]  DOB:  08/11/1970  DATE OF SERVICE: 01/25/2020    Chief Complaint:  Roe Coombs Dysfunction    HPI:  Austin Richmond is a 49 y.o. male who is an established patient that has been seen in clinic within the last 3 years. 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 Patient was last seen by me on 01/07/20 for evaluation of right aural fullness. Patient presented with right otitis media, for which he was prescribed Augmentin and Flonase.    Austin Richmond presents today for 3 week follow up. Patient reports no improvement in symptoms since his last visit. He completed Augmentin as prescribed. He states that his right ear continues to feel full and hearing is muffled. He is able to pop his ears by swallowing, which relieves pressure temporarily. He endorses bruxism and recent increased stress. Of note, patient notes hx of chronic tonsil hypertrophy. He denies recurrent throat pain.    Review of Systems:                                                        All other systems reviewed and found to be negative except as per HPI.    Physical Exam:  Blood pressure (!) 146/78, pulse 71, temperature 36.3 C (97.3 F), temperature source Thermal Scan, height 1.829 m (6'), weight 134 kg (296 lb).  Body mass index is 40.14 kg/m.  General Appearance: Pleasant, cooperative, healthy, and in no acute distress.  Eyes: Conjunctivae/corneas clear, PERRLA, EOM's intact.  Head and Face: Normocephalic, atraumatic.  Face symmetric, no obvious lesions. TMJ tenderness.  Right Ear:       Pinnae: Normal shape and position.        External auditory canals:  Patent without inflammation.       Tympanic membranes:  Intact, translucent, midposition, middle ear aerated.  Left Ear:       Pinnae: Normal shape and position.         External auditory canals:  Patent without inflammation.       Tympanic membranes:  Intact, translucent, midposition, middle ear aerated.  Nose: External pyramid midline. Mucosa normal. No purulence, polyps, or crusts.   Oral Cavity/Oropharynx: No mucosal lesions, masses, or pharyngeal asymmetry.  Tonsils: 3+.   Nasopharynx: Deferred.  Hypopharynx/Larynx: Deferred.  Neck: no cervical adenopathy, no palpable thyroid or salivary gland masses  Thyroid: no significant thyroid abnormality by palpation  Cardiovascular: Good perfusion of upper extremities.  No cyanosis of the hands or fingers.  Lungs: No apparent stridorous breathing. No acute distress.  Skin: Skin warm and dry.  Neurologic: Cranial nerves: grossly intact.  Psychiatric: Alert and oriented x 3.    Data Reviewed:  N/A    Assessment:  1. TMJ Disorder.  2. Bruxism.  3. OM, right, improved.  4. ETD, right, improved.  5. Tonsil hypertrophy, chronic, stable.    Plan:  1. Discussed with the patient that ear infection has cleared and ears appear normal on exam. Lingering aural fullness is likely due to TMJ disorder.  2. Begin soft diet, warm compresses, use of dental guard and Motrin prn for TMJ  Disorder.  3. F/U prn for reevaluation.    Concha Norway, MD  Department of Otolaryngology  [facility]    PCP: Pcp Not In System  No address on file   REF: No referring provider defined for this encounter.     I am scribing for, and in the presence of, Dr. Whitney Post for services provided on 01/25/2020.  Roxana Hires, SCRIBE  Roxana Hires, SCRIBE,  01/25/2020, 15:18.    I personally performed the services described in this documentation, as scribed  in my presence, and it is both accurate  and complete.    Concha Norway, MD

## 2020-01-28 ENCOUNTER — Encounter (INDEPENDENT_AMBULATORY_CARE_PROVIDER_SITE_OTHER): Payer: Self-pay | Admitting: Otolaryngology

## 2020-03-01 ENCOUNTER — Other Ambulatory Visit (INDEPENDENT_AMBULATORY_CARE_PROVIDER_SITE_OTHER): Payer: Self-pay | Admitting: Otolaryngology

## 2020-03-01 DIAGNOSIS — H6690 Otitis media, unspecified, unspecified ear: Secondary | ICD-10-CM

## 2020-03-01 MED ORDER — AMOXICILLIN 875 MG-POTASSIUM CLAVULANATE 125 MG TABLET
1.0000 | ORAL_TABLET | Freq: Two times a day (BID) | ORAL | 0 refills | Status: DC
Start: 2020-03-01 — End: 2023-10-22

## 2020-03-01 NOTE — Telephone Encounter (Signed)
-----   Message from Concha Norway, MD sent at 03/01/2020  9:58 AM EST -----  Regarding: RE: refill  ok  ----- Message -----  From: Darnelle Going  Sent: 03/01/2020   9:37 AM EST  To: Concha Norway, MD, Arlie Solomons. Natacha Jepsen, RTR  Subject: refill                                           Patient called stated that they would like a refill on their amoxicillin. They are having problems with their ear again and that helped the last time. They have a follow up scheduled on 2/7. Would like it sent to [facility] in [place].

## 2020-03-27 ENCOUNTER — Encounter (INDEPENDENT_AMBULATORY_CARE_PROVIDER_SITE_OTHER): Payer: Self-pay | Admitting: Otolaryngology

## 2020-06-19 ENCOUNTER — Other Ambulatory Visit: Payer: Self-pay

## 2020-06-19 ENCOUNTER — Ambulatory Visit: Payer: 59 | Attending: Physician Assistant

## 2020-06-19 DIAGNOSIS — E1122 Type 2 diabetes mellitus with diabetic chronic kidney disease: Secondary | ICD-10-CM | POA: Insufficient documentation

## 2020-06-19 DIAGNOSIS — Z125 Encounter for screening for malignant neoplasm of prostate: Secondary | ICD-10-CM | POA: Insufficient documentation

## 2020-06-19 DIAGNOSIS — R590 Localized enlarged lymph nodes: Secondary | ICD-10-CM | POA: Insufficient documentation

## 2020-06-19 DIAGNOSIS — N186 End stage renal disease: Secondary | ICD-10-CM | POA: Insufficient documentation

## 2020-06-19 LAB — CBC WITH DIFF
BASOPHIL #: <0.1 10*3/uL (ref <=0.20)
BASOPHIL %: 1 %
EOSINOPHIL #: <0.1 10*3/uL (ref <=0.50)
EOSINOPHIL %: 2 %
HCT: 45.9 % (ref 38.9–52.0)
HGB: 14.8 g/dL (ref 13.4–17.5)
IMMATURE GRANULOCYTE #: <0.1 10*3/uL (ref <0.10)
IMMATURE GRANULOCYTE %: 1 % (ref 0–1)
LYMPHOCYTE #: 1.55 10*3/uL (ref 1.00–4.80)
LYMPHOCYTE %: 41 %
MCH: 28.4 pg (ref 26.0–32.0)
MCHC: 32.2 g/dL (ref 31.0–35.5)
MCV: 87.9 fL (ref 78.0–100.0)
MONOCYTE #: 0.43 10*3/uL (ref 0.20–1.10)
MONOCYTE %: 11 %
MPV: 10.1 fL (ref 8.7–12.5)
NEUTROPHIL #: 1.73 10*3/uL (ref 1.50–7.70)
NEUTROPHIL %: 44 %
PLATELETS: 268 10*3/uL (ref 150–400)
RBC: 5.22 10*6/uL (ref 4.50–6.10)
RDW-CV: 12.5 % (ref 11.5–15.5)
WBC: 3.8 10*3/uL (ref 3.7–11.0)

## 2020-06-19 LAB — COMPREHENSIVE METABOLIC PNL, FASTING
ALBUMIN/GLOBULIN RATIO: 1.2 (ref >=1.0)
ALBUMIN: 4.4 g/dL (ref 3.5–5.2)
ALKALINE PHOSPHATASE: 66 U/L (ref 41–133)
ALT (SGPT): 19 U/L (ref 0–55)
ANION GAP: 9 mmol/L (ref 6–15)
AST (SGOT): 23 U/L (ref 5–34)
BILIRUBIN TOTAL: 0.7 mg/dL (ref 0.2–1.2)
BUN: 12 mg/dL (ref 7–21)
CALCIUM: 9 mg/dL (ref 8.0–10.6)
CHLORIDE: 103 mmol/L (ref 98–107)
CO2 TOTAL: 28 mmol/L (ref 21–32)
CREATININE: 1 mg/dL (ref 0.80–1.60)
ESTIMATED GFR: >60 mL/min/{1.73_m2}
GLUCOSE: 127 mg/dL — ABNORMAL HIGH (ref 70–100)
POTASSIUM: 2.9 mmol/L — CL (ref 3.3–5.1)
PROTEIN TOTAL: 8.2 g/dL (ref 6.4–8.3)
SODIUM: 140 mmol/L (ref 136–146)

## 2020-06-19 LAB — LIPID PANEL
CHOL/HDL RATIO: 2.6
CHOLESTEROL: 121 mg/dL (ref 75–200)
HDL CHOL: 46 mg/dL (ref 27–58)
LDL CALC: 60 mg/dL (ref 0–99)
TRIGLYCERIDES: 74 mg/dL (ref 28–153)
VLDL CALC: 15 mg/dL (ref 0–50)

## 2020-06-20 LAB — PSA, DIAGNOSTIC: PSA, TOTAL: 0.16 ng/mL (ref <=4.00)

## 2020-06-20 LAB — ALBUMIN, RANDOM URINE W/CREATININE
ALBUMIN, URINE: 9 mg/dL
ALBUMIN/CREATININE RATIO, RANDOM URINE: 50 mcg/mg creat — ABNORMAL HIGH (ref <30)
CREATININE, RANDOM URINE: 180 mg/dL (ref 20–320)

## 2020-06-20 LAB — HGA1C (HEMOGLOBIN A1C WITH EST AVG GLUCOSE): HEMOGLOBIN A1C: 6.1 % — ABNORMAL HIGH (ref 4.0–6.0)

## 2020-06-26 ENCOUNTER — Ambulatory Visit (INDEPENDENT_AMBULATORY_CARE_PROVIDER_SITE_OTHER): Payer: Self-pay | Admitting: Internal Medicine

## 2020-07-10 ENCOUNTER — Other Ambulatory Visit: Payer: Self-pay

## 2020-07-10 ENCOUNTER — Ambulatory Visit: Payer: 59 | Attending: Physician Assistant

## 2020-07-10 DIAGNOSIS — E876 Hypokalemia: Secondary | ICD-10-CM | POA: Insufficient documentation

## 2020-07-10 LAB — COMPREHENSIVE METABOLIC PANEL, NON-FASTING
ALBUMIN/GLOBULIN RATIO: 1.3 (ref >=1.0)
ALBUMIN: 4.1 g/dL (ref 3.5–5.2)
ALKALINE PHOSPHATASE: 65 U/L (ref 41–133)
ALT (SGPT): 20 U/L (ref 0–55)
ANION GAP: 8 mmol/L (ref 6–15)
AST (SGOT): 19 U/L (ref 5–34)
BILIRUBIN TOTAL: 0.5 mg/dL (ref 0.2–1.2)
BUN: 10 mg/dL (ref 7–21)
CALCIUM: 8.7 mg/dL (ref 8.0–10.6)
CHLORIDE: 103 mmol/L (ref 98–107)
CO2 TOTAL: 31 mmol/L (ref 21–32)
CREATININE: 0.9 mg/dL (ref 0.80–1.60)
ESTIMATED GFR: >60 mL/min/{1.73_m2}
GLUCOSE: 158 mg/dL — ABNORMAL HIGH (ref 70–100)
POTASSIUM: 3.2 mmol/L — ABNORMAL LOW (ref 3.3–5.1)
PROTEIN TOTAL: 7.3 g/dL (ref 6.4–8.3)
SODIUM: 142 mmol/L (ref 136–146)

## 2020-11-21 ENCOUNTER — Other Ambulatory Visit (HOSPITAL_COMMUNITY): Payer: Self-pay | Admitting: Physician Assistant

## 2020-11-21 DIAGNOSIS — R590 Localized enlarged lymph nodes: Secondary | ICD-10-CM

## 2020-11-24 ENCOUNTER — Encounter (INDEPENDENT_AMBULATORY_CARE_PROVIDER_SITE_OTHER): Payer: Self-pay | Admitting: Otolaryngology

## 2020-11-24 ENCOUNTER — Ambulatory Visit (INDEPENDENT_AMBULATORY_CARE_PROVIDER_SITE_OTHER): Payer: 59 | Admitting: Otolaryngology

## 2020-11-24 ENCOUNTER — Other Ambulatory Visit: Payer: Self-pay

## 2020-11-24 VITALS — Temp 97.7°F | Ht 72.0 in | Wt 299.9 lb

## 2020-11-24 DIAGNOSIS — J351 Hypertrophy of tonsils: Secondary | ICD-10-CM

## 2020-11-24 DIAGNOSIS — H6980 Other specified disorders of Eustachian tube, unspecified ear: Secondary | ICD-10-CM

## 2020-11-24 DIAGNOSIS — H6690 Otitis media, unspecified, unspecified ear: Secondary | ICD-10-CM

## 2020-11-24 DIAGNOSIS — M26609 Unspecified temporomandibular joint disorder, unspecified side: Secondary | ICD-10-CM

## 2020-11-24 DIAGNOSIS — F458 Other somatoform disorders: Secondary | ICD-10-CM

## 2020-11-24 DIAGNOSIS — G4763 Sleep related bruxism: Secondary | ICD-10-CM

## 2020-11-24 DIAGNOSIS — H6691 Otitis media, unspecified, right ear: Secondary | ICD-10-CM

## 2020-11-24 DIAGNOSIS — H6981 Other specified disorders of Eustachian tube, right ear: Secondary | ICD-10-CM

## 2020-11-24 MED ORDER — IPRATROPIUM BROMIDE 42 MCG (0.06 %) NASAL SPRAY
2.00 | Freq: Two times a day (BID) | NASAL | 3 refills | Status: DC
Start: 2020-11-24 — End: 2021-01-26

## 2020-11-24 NOTE — Progress Notes (Signed)
ENT, [place]  [place] [id number]  [phone]    PATIENT NAME:  Austin Richmond  MRN:  [id number]  DOB:  10-25-1970  DATE OF SERVICE: 11/24/2020    Chief Complaint:  Nasal Obstruction    HPI:  Austin Richmond is a 50 y.o. male who is an established patient that has been seen in clinic within the last 3 years. Patient is s/p excisional biopsy of exophytic uvula lesionon 08/18/19 at [facility] (papilloma on pathology). Patient was last seen by me on 01/25/2020 for evaluation of ETD.     Austin Richmond presents today for follow up ENT evaluation.     At today's visit, the patient reports nasal inflammation after presenting to his PCP. He reports intermittent nasal congestion. Of note, patient notes hx of chronic tonsil hypertrophy. He denies recurrent throat pain.    Review of Systems:                                                        All other systems reviewed and found to be negative except as per HPI.    Physical Exam:  Temperature 36.5 C (97.7 F), temperature source Thermal Scan, height 1.829 m (6'), weight 136 kg (299 lb 14.4 oz).  Body mass index is 40.67 kg/m.  General Appearance: Pleasant, cooperative, healthy, and in no acute distress.  Eyes: Conjunctivae/corneas clear, PERRLA, EOM's intact.  Head and Face: Normocephalic, atraumatic.  Face symmetric, no obvious lesions. TMJ tenderness.  Right Ear:       Pinnae: Normal shape and position.        External auditory canals:  Patent without inflammation.       Tympanic membranes:  Intact, translucent, midposition, middle ear aerated.  Left Ear:       Pinnae: Normal shape and position.        External auditory canals:  Patent without inflammation.       Tympanic membranes:  Intact, translucent, midposition, middle ear aerated.  Nose: External pyramid midline. Mucosa normal. No purulence, polyps, or crusts.   Oral Cavity/Oropharynx: No mucosal lesions, masses, or pharyngeal asymmetry.  Tonsils: 3+.    Nasopharynx: Deferred.  Hypopharynx/Larynx: Deferred.  Neck: no cervical adenopathy, no palpable thyroid or salivary gland masses  Thyroid: no significant thyroid abnormality by palpation  Cardiovascular: Good perfusion of upper extremities.  No cyanosis of the hands or fingers.  Lungs: No apparent stridorous breathing. No acute distress.  Skin: Skin warm and dry.  Neurologic: Cranial nerves: grossly intact.  Psychiatric: Alert and oriented x 3.    Data Reviewed:  N/A    Assessment:  1. TMJ Disorder.  2. Bruxism.  3. OM, right, improved.  4. ETD, right, improved.  5. Tonsil hypertrophy, chronic, stable.    Plan:  Orders Placed This Encounter   . ipratropium bromide (ATROVENT) 42 mcg (0.06 %) Nasal Spray, Non-Aerosol       1. Discontinue flonase  2. Prescribed Atrovent for nasal congestion.   3. F/U in one month for reevaluation.    Concha Norway, MD  Department of Otolaryngology  [facility]    PCP: Pcp Not In System  No address on file   REF: Pcp, No  No address on file     I am scribing for,  and in the presence of, Dr. Concha Norway, MD for services provided on 11/24/2020.  Lindalou Hose, SCRIBE     I personally performed the services described in this documentation, as scribed  in my presence, and it is both accurate  and complete.    Concha Norway, MD

## 2020-12-13 ENCOUNTER — Ambulatory Visit (HOSPITAL_BASED_OUTPATIENT_CLINIC_OR_DEPARTMENT_OTHER): Payer: Self-pay

## 2020-12-15 ENCOUNTER — Ambulatory Visit (HOSPITAL_BASED_OUTPATIENT_CLINIC_OR_DEPARTMENT_OTHER): Payer: Self-pay

## 2020-12-19 ENCOUNTER — Other Ambulatory Visit: Payer: Self-pay

## 2020-12-19 ENCOUNTER — Inpatient Hospital Stay
Admission: RE | Admit: 2020-12-19 | Discharge: 2020-12-19 | Disposition: A | Discharge status: Home / Self Care | Payer: 59 | Source: Ambulatory Visit | Attending: Physician Assistant | Admitting: Physician Assistant

## 2020-12-19 DIAGNOSIS — R590 Localized enlarged lymph nodes: Secondary | ICD-10-CM | POA: Insufficient documentation

## 2020-12-20 ENCOUNTER — Encounter (HOSPITAL_COMMUNITY): Payer: Self-pay | Admitting: Radiology

## 2020-12-25 ENCOUNTER — Encounter (INDEPENDENT_AMBULATORY_CARE_PROVIDER_SITE_OTHER): Payer: Self-pay | Admitting: Otolaryngology

## 2020-12-29 ENCOUNTER — Encounter (INDEPENDENT_AMBULATORY_CARE_PROVIDER_SITE_OTHER): Payer: Self-pay | Admitting: Otolaryngology

## 2021-01-26 ENCOUNTER — Other Ambulatory Visit (INDEPENDENT_AMBULATORY_CARE_PROVIDER_SITE_OTHER): Payer: Self-pay | Admitting: Otolaryngology

## 2021-01-26 MED ORDER — IPRATROPIUM BROMIDE 42 MCG (0.06 %) NASAL SPRAY
2.0000 | Freq: Two times a day (BID) | NASAL | 3 refills | Status: DC
Start: 2021-01-26 — End: 2023-10-22

## 2021-01-26 NOTE — Telephone Encounter (Addendum)
-----   [facility] at 01/26/2021 11:21 AM EST -----  Regarding: nasal spray  Patient called requesting a refill on his nasal spray be sent to his pharmacy    Atrovent nasal spray refill sent to Dr. Whitney Post for approval.

## 2021-05-24 ENCOUNTER — Other Ambulatory Visit: Payer: Self-pay

## 2021-05-24 ENCOUNTER — Other Ambulatory Visit: Payer: 59 | Attending: Physician Assistant

## 2021-05-24 DIAGNOSIS — E1129 Type 2 diabetes mellitus with other diabetic kidney complication: Secondary | ICD-10-CM | POA: Insufficient documentation

## 2021-05-24 DIAGNOSIS — R590 Localized enlarged lymph nodes: Secondary | ICD-10-CM | POA: Insufficient documentation

## 2021-05-24 LAB — CBC WITH DIFF
BASOPHIL #: <0.1 10*3/uL (ref <=0.20)
BASOPHIL %: 1 %
EOSINOPHIL #: <0.1 10*3/uL (ref <=0.50)
EOSINOPHIL %: 2 %
HCT: 42.8 % (ref 38.9–52.0)
HGB: 14.1 g/dL (ref 13.4–17.5)
IMMATURE GRANULOCYTE #: <0.1 10*3/uL (ref <0.10)
IMMATURE GRANULOCYTE %: 1 % (ref 0–1)
LYMPHOCYTE #: 1.15 10*3/uL (ref 1.00–4.80)
LYMPHOCYTE %: 42 %
MCH: 29.5 pg (ref 26.0–32.0)
MCHC: 32.9 g/dL (ref 31.0–35.5)
MCV: 89.5 fL (ref 78.0–100.0)
MONOCYTE #: 0.33 10*3/uL (ref 0.20–1.10)
MONOCYTE %: 12 %
MPV: 9.9 fL (ref 8.7–12.5)
NEUTROPHIL #: 1.12 10*3/uL — ABNORMAL LOW (ref 1.50–7.70)
NEUTROPHIL %: 42 %
PLATELETS: 237 10*3/uL (ref 150–400)
RBC: 4.78 10*6/uL (ref 4.50–6.10)
RDW-CV: 12.9 % (ref 11.5–15.5)
WBC: 2.7 10*3/uL — ABNORMAL LOW (ref 3.7–11.0)

## 2021-05-24 LAB — URINALYSIS, MACRO/MICRO
BILIRUBIN: NEGATIVE mg/dL
BLOOD: NEGATIVE mg/dL
GLUCOSE: >=1000 mg/dL — AB
KETONES: NEGATIVE mg/dL
LEUKOCYTES: NEGATIVE WBCs/uL
NITRITE: NEGATIVE
PH: 7.5 (ref 5.0–9.0)
SPECIFIC GRAVITY: 1.032 — ABNORMAL HIGH (ref 1.001–1.030)
UROBILINOGEN: 0.2 mg/dL (ref 0.2–1.0)

## 2021-05-24 LAB — COMPREHENSIVE METABOLIC PANEL, NON-FASTING
ALBUMIN: 3.7 g/dL (ref 3.5–5.0)
ALKALINE PHOSPHATASE: 63 U/L (ref 45–115)
ALT (SGPT): 22 U/L (ref 10–55)
ANION GAP: 6 mmol/L (ref 4–13)
AST (SGOT): 25 U/L (ref 8–45)
BILIRUBIN TOTAL: 0.7 mg/dL (ref 0.3–1.3)
BUN/CREA RATIO: 10 (ref 6–22)
BUN: 10 mg/dL (ref 8–25)
CALCIUM: 9 mg/dL (ref 8.5–10.0)
CHLORIDE: 104 mmol/L (ref 96–111)
CO2 TOTAL: 30 mmol/L (ref 22–30)
CREATININE: 0.96 mg/dL (ref 0.75–1.35)
ESTIMATED GFR: >90 mL/min/BSA (ref >=60)
GLUCOSE: 109 mg/dL (ref 65–125)
POTASSIUM: 2.6 mmol/L — CL (ref 3.5–5.1)
PROTEIN TOTAL: 7.5 g/dL (ref 6.4–8.3)
SODIUM: 140 mmol/L (ref 136–145)

## 2021-05-24 LAB — URINALYSIS, MICROSCOPIC: BACTERIA: NEGATIVE /hpf

## 2021-05-24 LAB — LIPID PANEL
CHOL/HDL RATIO: 2.7
CHOLESTEROL: 116 mg/dL (ref 100–200)
HDL CHOL: 43 mg/dL — ABNORMAL LOW (ref >=50)
LDL CALC: 60 mg/dL (ref <100)
NON-HDL: 73 mg/dL (ref <=190)
TRIGLYCERIDES: 59 mg/dL (ref <150)
VLDL CALC: 9 mg/dL (ref <30)

## 2021-05-24 LAB — HGA1C (HEMOGLOBIN A1C WITH EST AVG GLUCOSE)
ESTIMATED AVERAGE GLUCOSE: 134 mg/dL
HEMOGLOBIN A1C: 6.3 % — ABNORMAL HIGH (ref <=5.7)

## 2021-05-24 LAB — MICROALBUMIN/CREATININE RATIO, URINE, RANDOM
CREATININE RANDOM URINE: 135 mg/dL — ABNORMAL HIGH (ref 50–100)
MICROALBUMIN RANDOM URINE: 5.8 mg/dL
MICROALBUMIN/CREATININE RATIO RANDOM URINE: 43 mg/g — ABNORMAL HIGH (ref <30.0)

## 2021-05-25 ENCOUNTER — Other Ambulatory Visit (HOSPITAL_COMMUNITY): Payer: Self-pay | Admitting: Physician Assistant

## 2021-05-25 DIAGNOSIS — G4733 Obstructive sleep apnea (adult) (pediatric): Secondary | ICD-10-CM

## 2021-05-26 LAB — URINE CULTURE,ROUTINE: URINE CULTURE: NO GROWTH

## 2021-06-17 ENCOUNTER — Emergency Department (EMERGENCY_DEPARTMENT_HOSPITAL): Payer: 59

## 2021-06-17 ENCOUNTER — Emergency Department (HOSPITAL_COMMUNITY): Payer: 59

## 2021-06-17 ENCOUNTER — Emergency Department
Admission: EM | Admit: 2021-06-17 | Discharge: 2021-06-18 | Disposition: A | Discharge status: Home / Self Care | Payer: 59 | Attending: Emergency Medicine | Admitting: Emergency Medicine

## 2021-06-17 ENCOUNTER — Other Ambulatory Visit: Payer: Self-pay

## 2021-06-17 DIAGNOSIS — X509XXA Other and unspecified overexertion or strenuous movements or postures, initial encounter: Secondary | ICD-10-CM | POA: Insufficient documentation

## 2021-06-17 DIAGNOSIS — M5116 Intervertebral disc disorders with radiculopathy, lumbar region: Secondary | ICD-10-CM | POA: Insufficient documentation

## 2021-06-17 DIAGNOSIS — M5416 Radiculopathy, lumbar region: Secondary | ICD-10-CM

## 2021-06-17 DIAGNOSIS — E119 Type 2 diabetes mellitus without complications: Secondary | ICD-10-CM | POA: Insufficient documentation

## 2021-06-17 DIAGNOSIS — I10 Essential (primary) hypertension: Secondary | ICD-10-CM | POA: Insufficient documentation

## 2021-06-17 DIAGNOSIS — M48061 Spinal stenosis, lumbar region without neurogenic claudication: Secondary | ICD-10-CM | POA: Insufficient documentation

## 2021-06-17 DIAGNOSIS — Q631 Lobulated, fused and horseshoe kidney: Secondary | ICD-10-CM | POA: Insufficient documentation

## 2021-06-17 MED ORDER — LIDOCAINE 4 % TOPICAL PATCH
1.0000 | MEDICATED_PATCH | Freq: Every day | CUTANEOUS | 0 refills | Status: AC
Start: 2021-06-17 — End: 2021-06-23

## 2021-06-17 MED ORDER — LIDOCAINE 4 % TOPICAL PATCH
1.0000 | MEDICATED_PATCH | Freq: Every day | CUTANEOUS | 0 refills | Status: DC
Start: 2021-06-17 — End: 2021-06-17

## 2021-06-17 MED ORDER — CYCLOBENZAPRINE 5 MG TABLET
5.0000 mg | ORAL_TABLET | Freq: Three times a day (TID) | ORAL | 0 refills | Status: AC | PRN
Start: 2021-06-17 — End: 2021-06-27

## 2021-06-17 MED ORDER — LIDOCAINE 3.6 %-MENTHOL 1.25 % TOPICAL PATCH
1.0000 | MEDICATED_PATCH | CUTANEOUS | Status: DC
Start: 2021-06-17 — End: 2021-06-18
  Administered 2021-06-17: 1 via TRANSDERMAL
  Filled 2021-06-17: qty 1

## 2021-06-17 MED ORDER — KETOROLAC 30 MG/ML (1 ML) INJECTION SOLUTION
30.0000 mg | INTRAMUSCULAR | Status: AC
Start: 2021-06-17 — End: 2021-06-17
  Administered 2021-06-17: 30 mg via INTRAVENOUS
  Filled 2021-06-17: qty 1

## 2021-06-17 MED ORDER — FENTANYL (PF) 50 MCG/ML INJECTION SOLUTION
50.0000 ug | INTRAMUSCULAR | Status: AC
Start: 2021-06-17 — End: 2021-06-17
  Administered 2021-06-17: 50 ug via INTRAVENOUS
  Filled 2021-06-17: qty 2

## 2021-06-17 MED ORDER — CYCLOBENZAPRINE 5 MG TABLET
5.0000 mg | ORAL_TABLET | Freq: Three times a day (TID) | ORAL | 0 refills | Status: DC | PRN
Start: 2021-06-17 — End: 2021-06-17

## 2021-06-17 NOTE — ED Resident Handoff Note (Signed)
Emergency Department  Resident Course Note    Patient Name: Austin Richmond  Age and Gender: 51 y.o. male  Date of Birth: February 09, 1971  Date of Service: 06/17/2021  PCP: Austin Companion, PA-C  Attending: Clent Ridges, MD    After a thorough discussion of the patient including presentation, ED course, and review of above information I have assumed care of Austin Richmond from Dr. Iona Richmond at 23:47 06/17/2021    Triage Summary:   Back Pain (Lifting a fan yesterday and heard popping noise; right sided back pain that radiates to groin and thigh.)      HPI:  In brief, patient is a 51 y.o. 42 American male presenting with back pain after lifting fan yesterday. Instant pain and radiation down L leg. CT shows L4/L5 disc bulge     Pending Studies:  none    Plan:  Discharge in progress at time of signout    Course:  After assuming care of Austin Richmond, ED course included the following:   D/c in progress at time of signout. Discharged on flexeril, lidocaine patches and f/u with ortho spine and PCP for further management. Encouraged to return for new or worsening symptoms.     Clinical Impression:     Clinical Impression   Lumbar radiculopathy, acute (Primary)       Disposition: Discharged    Following the above history, physical exam, and studies, the patient was deemed stable and suitable for discharge and he will follow up with spine.  Medication instructions were discussed with the patient/patient's family. Patient/patient's family was advised to return to the ED with any new, concerning or worsening symptoms and follow up as directed. The patient/patient's family verbalized understanding of all instructions and had no further questions or concerns.    Follow up:   [place]  [place]        [facility]  [place]  [phone]    As  needed      Allergies:   Allergies   Allergen Reactions   . Seasonale [Levonorgestrel-Ethinyl Estrad]        Pertinent Imaging/Lab results:  Labs Ordered/Reviewed - No data to display  Results for orders placed or performed during the hospital encounter of 06/17/21   CT LUMBAR SPINE WO IV CONTRAST     Status: None (Preliminary result)    Narrative    CT LUMBAR SPINE WO IV CONTRAST performed on 06/17/2021 10:38 PM    INDICATION: 51 years old Male; back pain    TECHNIQUE: Axial images of the lumbar spine, with coned down field of view and multiplanar reconstructions using bone and soft tissue algorithms    RADIATION DOSE: 1401.98 (mGy.cm)    COMPARISON: CT abdomen pelvis June 2017    FINDINGS:   No acute fracture or traumatic malalignment. Vertebral body heights are maintained. Intervertebral disc spaces are preserved. No evidence of spondylolisthesis. Mild multilevel degenerative change without high-grade neural foraminal or spinal canal stenosis. Mild broad-based disc bulge at L2-L3, L3-L4, and L5-S1. Mild bilateral neural foraminal stenosis at L5-S1. Broad-based disc bulge at L4-L5 with protrusion at the left subarticular/foraminal zone, resulting in moderate left neural foraminal stenosis.      Impression    Asymmetric broad-based disc bulge at L4-L5 with resultant moderate left neural foraminal stenosis.         *Parts of this  patients chart were completed in a retrospective fashion due to simultaneous direct patient care activities in the Emergency Department.   *This note was partially generated using MModal Fluency Direct system, and there may be some incorrect words, spellings, and punctuation that were not noted in checking the note before saving.      Joanne Chars, MD/  PGY-2 Emergency Medicine  [facility]  06/17/21 23:47

## 2021-06-17 NOTE — ED Provider Notes (Signed)
[facility] - Emergency Department  ED Primary Provider Note  History of Present Illness   Chief Complaint   Patient presents with   . Back Pain     Lifting a fan yesterday and heard popping noise; right sided back pain that radiates to groin and thigh.     Austin Richmond is a 51 y.o. male who had concerns including Back Pain.  Arrival: The patient arrived by Car    HPI   Patient was lifting a fan last night (06/16/21) when he heard a pop and had sudden onset of lower back pain. Patient describes the pain as sharp and states it radiates to his L thigh and L groin. Patient states it is painful to walk. Patient took ibuprofen with minimal relief to Sx. Denies bowel incontinence, urinary incontinence, LLE weakness, fever, chills, numbness, and any other Sx at this time. PHx diabetes and HTN. Denies IVDU. Denies steroid use. Denies PHx cancer.       Review of Systems   Pertinent positive and negative ROS as per HPI.  Historical Data   History Reviewed This Encounter: Medical History  Surgical History  Family History  Social History      Physical Exam   ED Triage Vitals [06/17/21 2052]   BP (Non-Invasive) (!) 166/94   Heart Rate 84   Respiratory Rate 18   Temperature 36.4 C (97.5 F)   SpO2 93 %   Weight 136 kg (299 lb)   Height 1.829 m (6')     Physical Exam  Musculoskeletal:      Lumbar back: Tenderness present. Positive left straight leg raise test.      Comments: Lumbar midspinal TTP. L paraspinal TTP.    Skin:     Findings: No rash.   Neurological:      Sensory: Sensation is intact.      Motor: No weakness.      Comments: Sensation intact to BLE. Strength intact to BLE.             Patient Data   Labs Ordered/Reviewed - No data to display  CT LUMBAR SPINE WO IV CONTRAST   Preliminary Result by Edi, Radresults In (04/30 2247)      Asymmetric broad-based disc bulge at L4-L5 with resultant moderate left neural foraminal stenosis.           Medical Decision Making        MDM patient is a  51 year old male who presents with acute onset lumbar back pain radiating to his left thigh after lifting a heavy fan yesterday.  On presentation, patient is in acute distress secondary to pain.  Hemodynamically stable, afebrile.  Exam notable for midline lumbar back pain to palpation, left-sided paraspinal pain to palpation.  He has normal strength and sensation of the bilateral lower extremities.  No bowel or bladder incontinence.    CT scan obtained that is notable for bulging disc at L4/L5 with moderate left neural foraminal stenosis.    Plan to discharge home with with a prescription for Flexeril, lidocaine patch, and follow-up with Spine Clinic.  I discussed pain control with scheduled ibuprofen, Flexeril, lidocaine, ice packs.  Also discussed the importance of staying mobile.  Patient is in agreement with this plan to follow-up in spine clinic.  Return precautions provided.         Medications Administered in the ED   lidocaine-menthol [facility]) 3.6%-1.25% patch (1 Patch Transdermal Patch Applied 06/17/21 2222)   fentaNYL (SUBLIMAZE) 50  mcg/mL injection (50 mcg Intravenous Given 06/17/21 2223)   ketorolac (TORADOL) 30 mg/mL injection (30 mg Intravenous Given 06/17/21 2223)     Clinical Impression   Lumbar radiculopathy, acute (Primary)       Disposition: Discharged        I am scribing for, and in the presence of, Dr. Tawni Levy for services provided on 06/17/2021  Gardiner Rhyme, SCRIBE    // Gardiner Rhyme, [place]  06/17/2021, 21:17      I personally performed the services described in this documentation, as scribed  in my presence, and it is both accurate  and complete.    Tawni Levy, MD

## 2021-06-17 NOTE — ED Attending Note (Signed)
Note begun by:  Clent Ridges, MD 06/17/2021, 21:09    I was physically present and directly supervised this patient's care.  Patient seen and examined.  Resident / Aron Baba / NP history and exam reviewed.   Key elements in addition to and/or correction of that documentation are as follows:    HPI :    51 y.o. male presents with chief complaint:  Back Pain (Lifting a fan yesterday and heard popping noise; right sided back pain that radiates to groin and thigh.)  Acute onset of back pain while lifting a fan yesterday.  No improvement with IBU.    No leg weakness.    No incontinence.  No fever/chills.  No IVUD hx.    Pain worse with walking.      See resident/MLP note for further details regarding this patients presentation.      The following pertinent past medical history was reviewed with the patient    Past Medical History:   Diagnosis Date   . Arthritis    . CPAP (continuous positive airway pressure) dependence    . Deep vein thrombosis (DVT) (CMS HCC)     left leg   . Diabetes mellitus, type 2 (CMS HCC)    . Gout    . History of anesthesia complications     pt never had anesthesia   . HTN (hypertension)    . Hyperlipidemia    . Neck problem     arthritis neck   . Sleep apnea    . Type 2 diabetes mellitus (CMS HCC)            PE :     Filed Vitals:    06/17/21 2052   BP: (!) 166/94   Pulse: 84   Resp: 18   Temp: 36.4 C (97.5 F)   SpO2: 93%      I have seen and examined patient and agree with Dr. Cleon Dew exam    Data/Test :    EKG :   Images Review by me : I personally reviewed all imaging studies   Image Reports Review by me : As above  Labs :   I reviewed the laboratory tests that were ordered in the ED.  The following include any abnormal lab values if any:  Labs Ordered/Reviewed - No data to display         Review of Prior Data :       Prior Images : None  Prior EKG : None  Online Medical Records : None  Transfer Docs/Images : None    Clinical Impression :   Back pain; L4/5 disc bulge    MDM :   Medical  Decision Making  Lumbar radiculopathy, acute: acute illness or injury  Amount and/or Complexity of Data Reviewed  Radiology: ordered.      Risk  OTC drugs.  Prescription drug management.  Parenteral controlled substances.          ED Course :     Vitals:    06/17/21 2052   BP: (!) 166/94   Pulse: 84   Resp: 18   Temp: 36.4 C (97.5 F)   SpO2: 93%   Weight: 136 kg (299 lb)   Height: 1.829 m (6')   BMI: 40.64              Plan :     Orders Placed This Encounter   . CT LUMBAR SPINE WO IV CONTRAST   . FOLLOW-UP: ORTHOPEDICS - SPINE  [place]   . fentaNYL (SUBLIMAZE) 50 mcg/mL injection   . ketorolac (TORADOL) 30 mg/mL injection   . cyclobenzaprine (FLEXERIL) 5 mg Oral Tablet   . lidocaine 4 % Adhesive Patch, Medicated       Dispo :   Discharged    CRITICAL CARE : None    NOTE:  Documentation completed after the conclusion of patient care due to bedside patient care needs

## 2021-06-17 NOTE — ED Attending Handoff Note (Signed)
Patient checked out to me by prior attending. The plan for evaluation, management, and disposition for the patient was unchanged from that of the prior attending. Exceptions are outlined subsequently.  Back pain onset yesterday while lifting. L4-5 disc bulge on CT. No neurology deficits. No incontinence. Being discharged.

## 2021-06-18 ENCOUNTER — Ambulatory Visit: Payer: 59 | Attending: Emergency Medicine

## 2021-06-18 ENCOUNTER — Encounter (HOSPITAL_COMMUNITY): Payer: Self-pay

## 2021-06-18 DIAGNOSIS — M9963 Osseous and subluxation stenosis of intervertebral foramina of lumbar region: Secondary | ICD-10-CM

## 2021-06-18 DIAGNOSIS — M5127 Other intervertebral disc displacement, lumbosacral region: Secondary | ICD-10-CM

## 2021-06-18 DIAGNOSIS — M47816 Spondylosis without myelopathy or radiculopathy, lumbar region: Secondary | ICD-10-CM

## 2021-06-18 DIAGNOSIS — M5126 Other intervertebral disc displacement, lumbar region: Secondary | ICD-10-CM

## 2021-06-18 DIAGNOSIS — M48061 Spinal stenosis, lumbar region without neurogenic claudication: Secondary | ICD-10-CM

## 2021-06-18 DIAGNOSIS — M9961 Osseous and subluxation stenosis of intervertebral foramina of cervical region: Secondary | ICD-10-CM

## 2021-06-18 NOTE — ED Nurses Note (Signed)
Patient is being discharged home at this time. The patient has been provided with AVS and instructions with what to expect following discharge. The patients IV has been removed and a dressing applied to the site, bleeding controlled. The patients questions have been addressed and the ED provider has spoken with the patient in addition to myself. The patient is ambulatory to the exit with no visible difficulty.

## 2021-06-29 ENCOUNTER — Ambulatory Visit (INDEPENDENT_AMBULATORY_CARE_PROVIDER_SITE_OTHER): Payer: 59 | Admitting: Physician Assistant

## 2021-08-14 ENCOUNTER — Inpatient Hospital Stay
Admission: RE | Admit: 2021-08-14 | Discharge: 2021-08-14 | Disposition: A | Discharge status: Home / Self Care | Payer: 59 | Source: Ambulatory Visit | Attending: Physician Assistant | Admitting: Physician Assistant

## 2021-08-14 ENCOUNTER — Other Ambulatory Visit: Payer: Self-pay

## 2021-08-14 DIAGNOSIS — G4733 Obstructive sleep apnea (adult) (pediatric): Secondary | ICD-10-CM

## 2021-10-04 ENCOUNTER — Other Ambulatory Visit: Payer: 59 | Attending: Physician Assistant

## 2021-10-04 ENCOUNTER — Other Ambulatory Visit: Payer: Self-pay

## 2021-10-04 DIAGNOSIS — R590 Localized enlarged lymph nodes: Secondary | ICD-10-CM | POA: Insufficient documentation

## 2021-10-04 DIAGNOSIS — E1129 Type 2 diabetes mellitus with other diabetic kidney complication: Secondary | ICD-10-CM | POA: Insufficient documentation

## 2021-10-04 LAB — HGA1C (HEMOGLOBIN A1C WITH EST AVG GLUCOSE)
ESTIMATED AVERAGE GLUCOSE: 117 mg/dL
HEMOGLOBIN A1C: 5.7 % (ref <=5.7)

## 2021-10-04 LAB — LIPID PANEL
CHOL/HDL RATIO: 3.8
CHOLESTEROL: 150 mg/dL (ref 100–200)
HDL CHOL: 40 mg/dL — ABNORMAL LOW (ref >=50)
LDL CALC: 83 mg/dL (ref <100)
NON-HDL: 110 mg/dL (ref <=190)
TRIGLYCERIDES: 155 mg/dL — ABNORMAL HIGH (ref <150)
VLDL CALC: 24 mg/dL (ref <30)

## 2021-10-04 LAB — COMPREHENSIVE METABOLIC PANEL, NON-FASTING
ALBUMIN: 3.8 g/dL (ref 3.5–5.0)
ALKALINE PHOSPHATASE: 62 U/L (ref 45–115)
ALT (SGPT): 20 U/L (ref 10–55)
ANION GAP: 10 mmol/L (ref 4–13)
AST (SGOT): 26 U/L (ref 8–45)
BILIRUBIN TOTAL: 0.6 mg/dL (ref 0.3–1.3)
BUN/CREA RATIO: 11 (ref 6–22)
BUN: 14 mg/dL (ref 8–25)
CALCIUM: 8.8 mg/dL (ref 8.5–10.0)
CHLORIDE: 100 mmol/L (ref 96–111)
CO2 TOTAL: 31 mmol/L — ABNORMAL HIGH (ref 22–30)
CREATININE: 1.26 mg/dL (ref 0.75–1.35)
ESTIMATED GFR: 70 mL/min/BSA (ref >=60)
GLUCOSE: 128 mg/dL — ABNORMAL HIGH (ref 65–125)
POTASSIUM: 2.6 mmol/L — CL (ref 3.5–5.1)
PROTEIN TOTAL: 8 g/dL (ref 6.4–8.3)
SODIUM: 141 mmol/L (ref 136–145)

## 2021-10-04 LAB — CBC WITH DIFF
BASOPHIL #: <0.1 10*3/uL (ref <=0.20)
BASOPHIL %: 1 %
EOSINOPHIL #: <0.1 10*3/uL (ref <=0.50)
EOSINOPHIL %: 2 %
HCT: 44.4 % (ref 38.9–52.0)
HGB: 15.2 g/dL (ref 13.4–17.5)
IMMATURE GRANULOCYTE #: <0.1 10*3/uL (ref <0.10)
IMMATURE GRANULOCYTE %: 0 % (ref 0–1)
LYMPHOCYTE #: 1.67 10*3/uL (ref 1.00–4.80)
LYMPHOCYTE %: 52 %
MCH: 31.3 pg (ref 26.0–32.0)
MCHC: 34.2 g/dL (ref 31.0–35.5)
MCV: 91.4 fL (ref 78.0–100.0)
MONOCYTE #: 0.34 10*3/uL (ref 0.20–1.10)
MONOCYTE %: 11 %
MPV: 10 fL (ref 8.7–12.5)
NEUTROPHIL #: 1.08 10*3/uL — ABNORMAL LOW (ref 1.50–7.70)
NEUTROPHIL %: 34 %
PLATELETS: 272 10*3/uL (ref 150–400)
RBC: 4.86 10*6/uL (ref 4.50–6.10)
RDW-CV: 12.5 % (ref 11.5–15.5)
WBC: 3.2 10*3/uL — ABNORMAL LOW (ref 3.7–11.0)

## 2021-10-10 ENCOUNTER — Other Ambulatory Visit: Payer: 59 | Attending: Physician Assistant

## 2021-10-10 ENCOUNTER — Other Ambulatory Visit: Payer: Self-pay

## 2021-10-10 DIAGNOSIS — E876 Hypokalemia: Secondary | ICD-10-CM | POA: Insufficient documentation

## 2021-10-10 LAB — CHLORIDE,RANDOM URINE: CHLORIDE RANDOM URINE: <20 mmol/L

## 2021-10-10 LAB — BASIC METABOLIC PANEL
ANION GAP: 6 mmol/L (ref 4–13)
BUN/CREA RATIO: 10 (ref 6–22)
BUN: 13 mg/dL (ref 8–25)
CALCIUM: 9.6 mg/dL (ref 8.5–10.0)
CHLORIDE: 103 mmol/L (ref 96–111)
CO2 TOTAL: 33 mmol/L — ABNORMAL HIGH (ref 22–30)
CREATININE: 1.33 mg/dL (ref 0.75–1.35)
ESTIMATED GFR: 66 mL/min/BSA (ref >=60)
GLUCOSE: 112 mg/dL (ref 65–125)
POTASSIUM: 2.9 mmol/L — ABNORMAL LOW (ref 3.5–5.1)
SODIUM: 142 mmol/L (ref 136–145)

## 2021-10-10 LAB — POTASSIUM,RANDOM URINE: POTASSIUM RANDOM URINE: 18 mmol/L

## 2021-10-10 LAB — OSMOLALITY: OSMOLALITY, BLOOD: 301 mOsm/kg (ref 278–305)

## 2021-10-10 LAB — SODIUM, RANDOM URINE: SODIUM RANDOM URINE: <20 mmol/L

## 2021-12-21 ENCOUNTER — Other Ambulatory Visit (HOSPITAL_COMMUNITY): Payer: Self-pay | Admitting: Physician Assistant

## 2021-12-21 DIAGNOSIS — R590 Localized enlarged lymph nodes: Secondary | ICD-10-CM

## 2021-12-26 ENCOUNTER — Inpatient Hospital Stay
Admission: RE | Admit: 2021-12-26 | Discharge: 2021-12-26 | Disposition: A | Discharge status: Home / Self Care | Payer: 59 | Source: Ambulatory Visit | Attending: Physician Assistant | Admitting: Physician Assistant

## 2021-12-26 ENCOUNTER — Other Ambulatory Visit: Payer: Self-pay

## 2021-12-26 DIAGNOSIS — R590 Localized enlarged lymph nodes: Secondary | ICD-10-CM | POA: Insufficient documentation

## 2022-02-20 ENCOUNTER — Other Ambulatory Visit: Payer: Self-pay

## 2022-02-20 ENCOUNTER — Other Ambulatory Visit: Payer: 59

## 2022-02-20 DIAGNOSIS — E119 Type 2 diabetes mellitus without complications: Secondary | ICD-10-CM

## 2022-02-20 DIAGNOSIS — E876 Hypokalemia: Secondary | ICD-10-CM

## 2022-02-20 LAB — BASIC METABOLIC PANEL
ANION GAP: 8 mmol/L (ref 4–13)
BUN/CREA RATIO: 17 (ref 6–22)
BUN: 14 mg/dL (ref 8–25)
CALCIUM: 8.7 mg/dL (ref 8.6–10.2)
CHLORIDE: 103 mmol/L (ref 96–111)
CO2 TOTAL: 26 mmol/L (ref 22–30)
CREATININE: 0.84 mg/dL (ref 0.75–1.35)
ESTIMATED GFR - MALE: >90 mL/min/BSA (ref >=60)
GLUCOSE: 119 mg/dL (ref 65–125)
POTASSIUM: 2.8 mmol/L — CL (ref 3.5–5.1)
SODIUM: 137 mmol/L (ref 136–145)

## 2022-02-20 LAB — LIPID PANEL
CHOL/HDL RATIO: 3.4
CHOLESTEROL: 164 mg/dL (ref 100–200)
HDL CHOL: 48 mg/dL — ABNORMAL LOW (ref >=50)
LDL CALC: 100 mg/dL — ABNORMAL HIGH (ref <100)
NON-HDL: 116 mg/dL (ref <=190)
TRIGLYCERIDES: 86 mg/dL (ref <150)
VLDL CALC: 14 mg/dL (ref <30)

## 2022-02-20 LAB — HGA1C (HEMOGLOBIN A1C WITH EST AVG GLUCOSE)
ESTIMATED AVERAGE GLUCOSE: 94 mg/dL
HEMOGLOBIN A1C: 4.9 % (ref <=5.7)

## 2022-03-19 ENCOUNTER — Other Ambulatory Visit (HOSPITAL_COMMUNITY): Payer: Self-pay

## 2022-03-19 DIAGNOSIS — K439 Ventral hernia without obstruction or gangrene: Secondary | ICD-10-CM

## 2022-03-20 ENCOUNTER — Other Ambulatory Visit (HOSPITAL_COMMUNITY): Payer: Self-pay

## 2022-03-20 DIAGNOSIS — K439 Ventral hernia without obstruction or gangrene: Secondary | ICD-10-CM

## 2022-03-21 ENCOUNTER — Inpatient Hospital Stay
Admission: RE | Admit: 2022-03-21 | Discharge: 2022-03-21 | Disposition: A | Discharge status: Home / Self Care | Payer: 59 | Source: Ambulatory Visit | Attending: DIAGNOSTIC RADIOLOGY | Admitting: DIAGNOSTIC RADIOLOGY

## 2022-03-21 ENCOUNTER — Other Ambulatory Visit: Payer: Self-pay

## 2022-03-21 DIAGNOSIS — K439 Ventral hernia without obstruction or gangrene: Secondary | ICD-10-CM

## 2022-03-21 DIAGNOSIS — K429 Umbilical hernia without obstruction or gangrene: Secondary | ICD-10-CM

## 2022-03-21 MED ORDER — IOPAMIDOL 370 MG IODINE/ML (76 %) INTRAVENOUS SOLUTION
100.0000 mL | INTRAVENOUS | Status: AC
Start: 2022-03-21 — End: 2022-03-21
  Administered 2022-03-21: 100 mL via INTRAVENOUS

## 2022-03-21 MED ORDER — DIATRIZOATE MEGLUMINE-DIATRIZOATE SODIUM 66 %-10 % ORAL SOLUTION
8.0000 mL | ORAL | Status: AC
Start: 2022-03-21 — End: 2022-03-21
  Administered 2022-03-21: 8 mL via ORAL

## 2022-11-26 ENCOUNTER — Emergency Department (HOSPITAL_COMMUNITY): Payer: 59

## 2022-11-26 ENCOUNTER — Emergency Department
Admission: EM | Admit: 2022-11-26 | Discharge: 2022-11-27 | Disposition: A | Discharge status: Home / Self Care | Payer: 59 | Attending: Emergency Medicine | Admitting: Emergency Medicine

## 2022-11-26 ENCOUNTER — Other Ambulatory Visit: Payer: Self-pay

## 2022-11-26 ENCOUNTER — Encounter (HOSPITAL_COMMUNITY): Payer: Self-pay

## 2022-11-26 DIAGNOSIS — M545 Low back pain, unspecified: Secondary | ICD-10-CM | POA: Insufficient documentation

## 2022-11-26 DIAGNOSIS — M549 Dorsalgia, unspecified: Secondary | ICD-10-CM

## 2022-11-26 MED ORDER — KETOROLAC 30 MG/ML (1 ML) INJECTION SOLUTION
30.0000 mg | INTRAMUSCULAR | Status: AC
Start: 2022-11-26 — End: 2022-11-26
  Administered 2022-11-26: 30 mg via INTRAMUSCULAR
  Filled 2022-11-26: qty 1

## 2022-11-26 MED ORDER — DIAZEPAM 5 MG TABLET
10.0000 mg | ORAL_TABLET | ORAL | Status: AC
Start: 2022-11-26 — End: 2022-11-26
  Administered 2022-11-26: 10 mg via ORAL
  Filled 2022-11-26: qty 2

## 2022-11-26 NOTE — ED Provider Notes (Signed)
Name: Austin Richmond MRN:  [id number]   Date: 11/26/2022 Age: 52 y.o.       Chief Complaint   Patient presents with    Back Pain       HPI: back pain, mostly on the left, radiates some around the left side, a little into the L inguinal area, not in testicles, not down the rest of the leg  Seemed to flare up while lifting at work today, no bowel or bladder dysfunction, no saddle anesthesia  No fevers, no recent injections  No other sx  Had somewhat similar pain a year or so ago, feels like sciatica    Problem list and medication list were reviewed    Past Medical History:   Diagnosis Date    Arthritis     CPAP (continuous positive airway pressure) dependence     Deep vein thrombosis (DVT) (CMS HCC)     left leg    Diabetes mellitus, type 2 (CMS HCC)     Gout     History of anesthesia complications     pt never had anesthesia    HTN (hypertension)     Hyperlipidemia     Neck problem     arthritis neck    Sleep apnea     Type 2 diabetes mellitus (CMS HCC)                Social History     Tobacco Use    Smoking status: Never    Smokeless tobacco: Never   Vaping Use    Vaping status: Never Used   Substance Use Topics    Alcohol use: No    Drug use: Yes     Types: Marijuana     Comment: daily-medical marijuana         Exam:  Vitals Recorded in This Encounter         11/26/2022  2201 11/26/2022  2203          BP: 162/103 --      Pulse: 77 --      Resp: 18 --      Temp: 36.3 C (97.3 F) --      SpO2: -- 97 %              vital signs and nursing note were reviewed.  HENT-normal  Neck-supple  Skin-w/d  Psych-normal behavior  Neuro-AAO, normal motor and SILT in LEs  Lymph-no cervical lymphadenopathy  Resp-normal effort, slow unlabored  Heart-RRR, normal distal pulses  Abd-soft, NT  Back - some mild L lower back and buttocks tenderness  Pelvis-nontender  Ext-no tenderness or significant edema      MDM:    History limitations: none    Additional history provided by, chart review, family    I independently reviewed ED imaging  and my findings are congruent with the radiology interpretation      Hemodynamic and pulse ox monitoring    IM therapy was administered    The patient was re-examined by me prior to final ED disposition    Reviewed prior records, imaging and lab results and they are congruent with the patient's stated history      Problems considered included, but not limited to:   Cardiovascular  Infectious  Inflammatory  Neurologic  Gastrointestinal  Immune  Pharmacologic  Endocrine      POCUS: no hydro, no AAA, see note    Procedures: n/a      Impression: low back pain  Plan: supportive care, ED obs, re-eval   Sx improved  No signs dangerous etiology  OK for DC, f/u, ret inst    See ED dispo tab for impression, disposition

## 2022-11-26 NOTE — ED Triage Notes (Signed)
Left lower back pain radiating into left buttocks that started today. Denies injury. Pt states it is difficult for him to sit down.

## 2022-11-27 MED ORDER — CELECOXIB 200 MG CAPSULE
200.0000 mg | ORAL_CAPSULE | Freq: Two times a day (BID) | ORAL | 0 refills | Status: AC
Start: 2022-11-27 — End: 2022-12-04

## 2022-11-27 MED ORDER — METHOCARBAMOL 500 MG TABLET
1000.0000 mg | ORAL_TABLET | Freq: Three times a day (TID) | ORAL | 0 refills | Status: AC | PRN
Start: 2022-11-27 — End: 2022-12-02

## 2022-11-27 NOTE — ED Nurses Note (Signed)
Patient discharged home with family.  AVS reviewed with patient/care giver.  A written copy of the AVS and discharge instructions was given to the patient/care giver.  Questions sufficiently answered as needed.  Patient/care giver encouraged to follow up with PCP as indicated.  In the event of an emergency, patient/care giver instructed to call 911 or go to the nearest emergency room.

## 2022-12-19 ENCOUNTER — Other Ambulatory Visit: Payer: Self-pay

## 2022-12-19 ENCOUNTER — Ambulatory Visit
Admission: RE | Admit: 2022-12-19 | Discharge: 2022-12-19 | Disposition: A | Discharge status: Home / Self Care | Payer: 59 | Source: Ambulatory Visit | Attending: Physician Assistant | Admitting: Physician Assistant

## 2022-12-19 ENCOUNTER — Other Ambulatory Visit (HOSPITAL_COMMUNITY): Payer: Self-pay | Admitting: Physician Assistant

## 2022-12-19 DIAGNOSIS — M545 Low back pain, unspecified: Secondary | ICD-10-CM | POA: Insufficient documentation

## 2023-02-21 IMAGING — MR MRI LUMBAR SPINE WITHOUT CONTRAST
5 series · 48 of 48 positions shown · non-contrast
Comparison: none

﻿

Pertinent Hx:    Twisted back in October.  Back pain.  Date of injury 12/18/2022.
TECHNIQUE: T1-weighted, T2-weighted, and inversion recovery sagittal images of the lumbar spine were taken.  T1 and T2-weighted axial images were also obtained.

[Series 2: T2 · sagittal · 4.5mm · 0.81mm/px · 7 of 15 slices shown (1 of 2)]
[im 1/15]
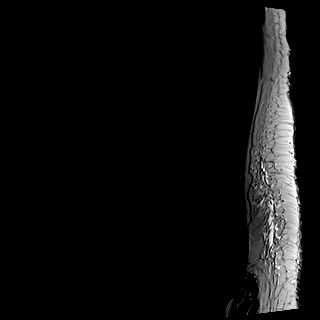
[im 3/15]
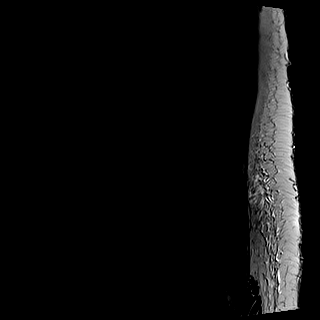
[im 5/15]
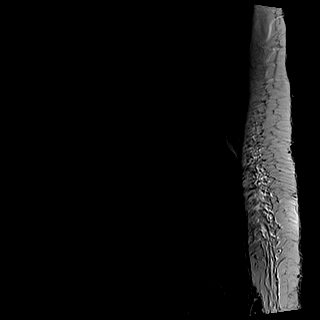
[im 8/15]
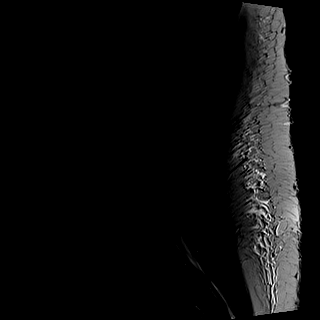
[im 10/15]
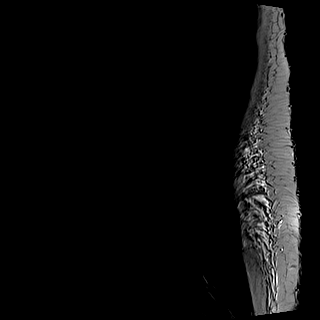
[im 12/15]
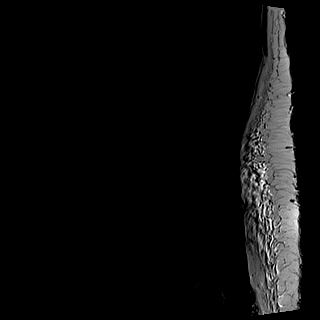
[im 15/15]
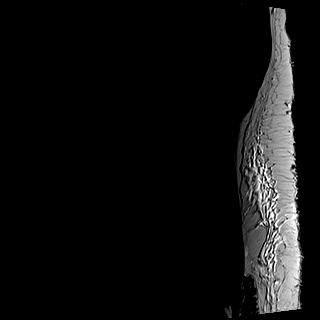

[Series 3: T1 · sagittal · 4.5mm · 0.81mm/px · 7 of 15 slices shown (1 of 2)]
[im 1/15]
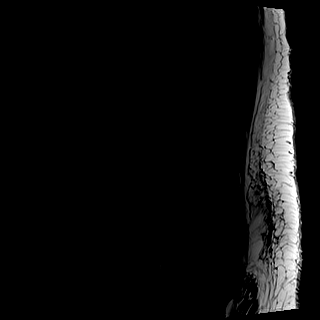
[im 3/15]
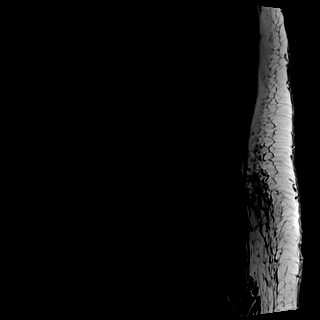
[im 5/15]
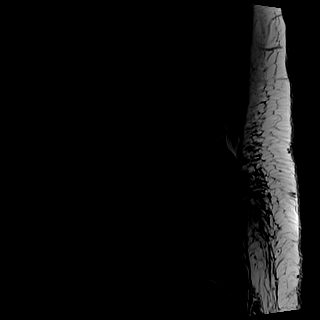
[im 8/15]
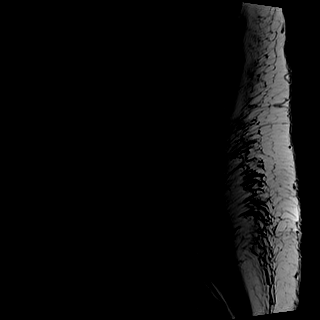
[im 10/15]
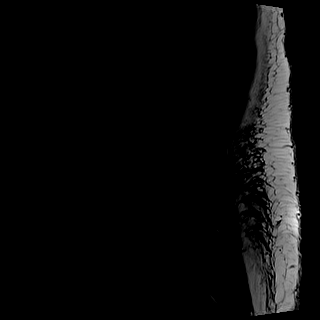
[im 12/15]
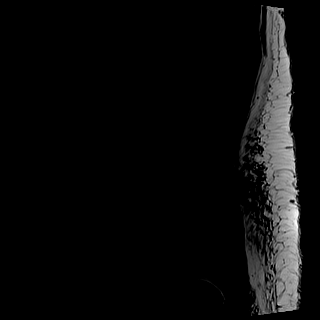
[im 15/15]
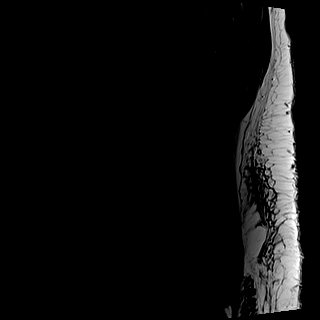

[Series 4: T2 · axial · 4.0mm · 0.70mm/px · z∈[-75,+134]mm · 14 of 33 slices shown (2 of 2)]
[im 1/33]
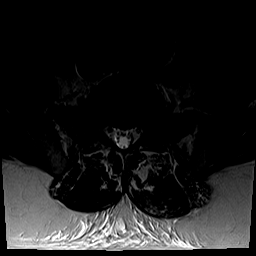
[im 3/33]
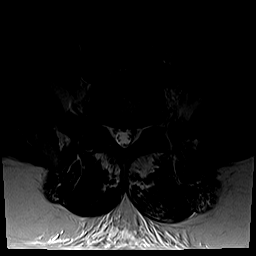
[im 5/33]
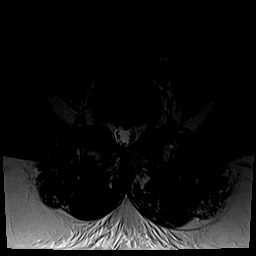
[im 8/33]
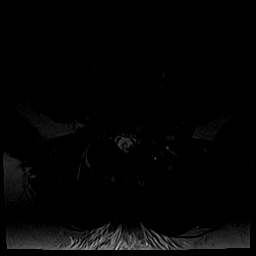
[im 10/33]
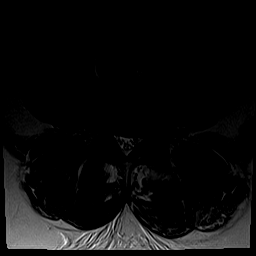
[im 13/33]
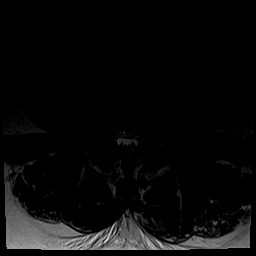
[im 15/33]
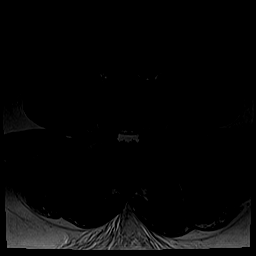
[im 18/33]
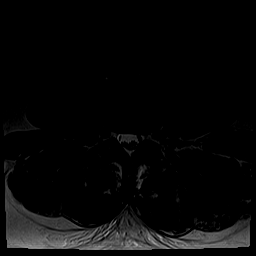
[im 20/33]
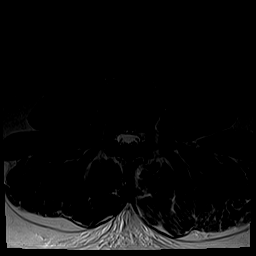
[im 23/33]
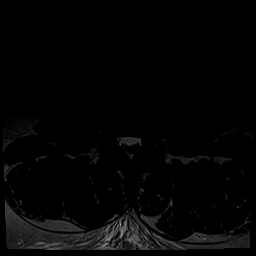
[im 25/33]
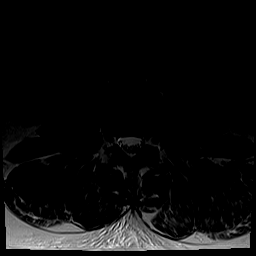
[im 28/33]
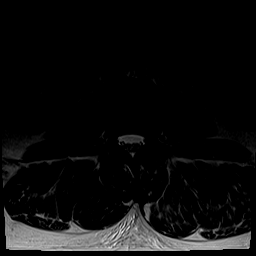
[im 30/33]
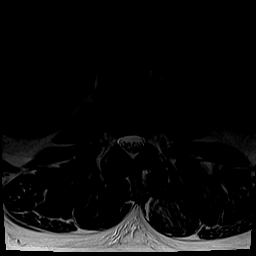
[im 33/33]
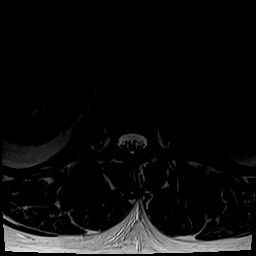

[Series 5: STIR · sagittal · 4.5mm · 1.02mm/px · 6 of 15 slices shown]
[im 1/15]
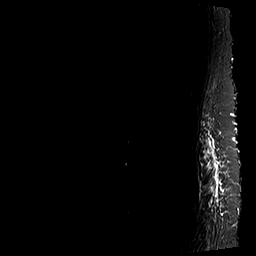
[im 3/15]
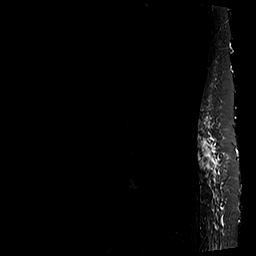
[im 6/15]
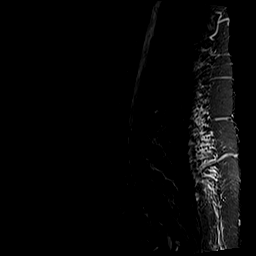
[im 9/15]
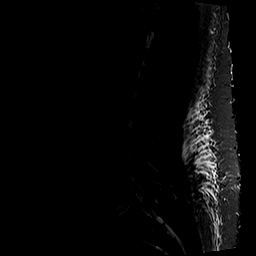
[im 12/15]
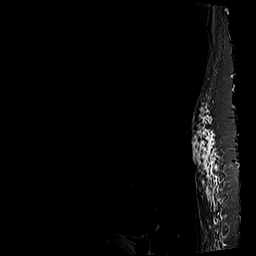
[im 15/15]
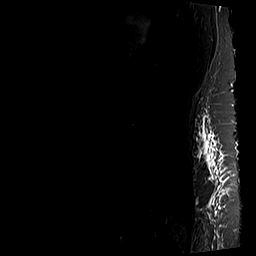

[Series 6: T1 · axial · 4.0mm · 0.70mm/px · z∈[-75,+134]mm · 14 of 33 slices shown (2 of 2)]
[im 1/33]
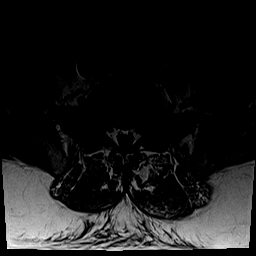
[im 3/33]
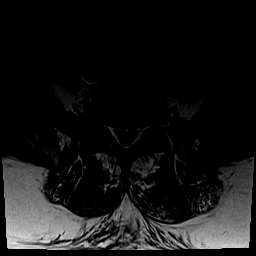
[im 5/33]
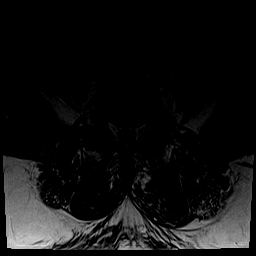
[im 8/33]
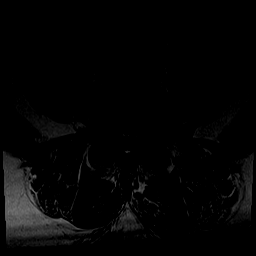
[im 10/33]
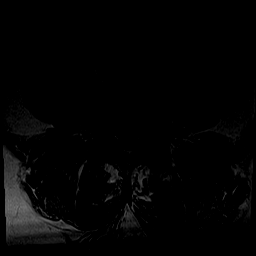
[im 13/33]
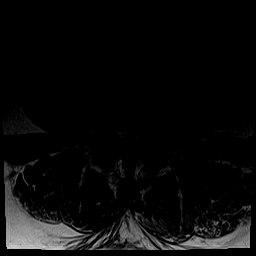
[im 15/33]
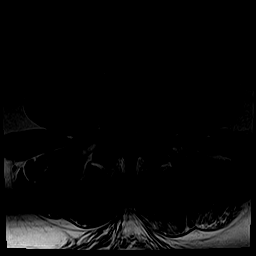
[im 18/33]
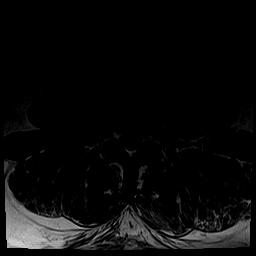
[im 20/33]
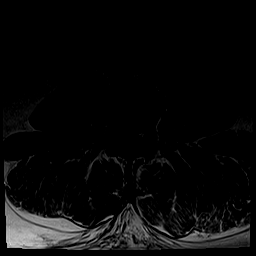
[im 23/33]
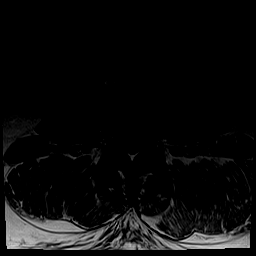
[im 25/33]
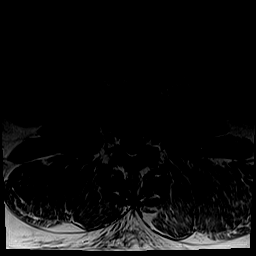
[im 28/33]
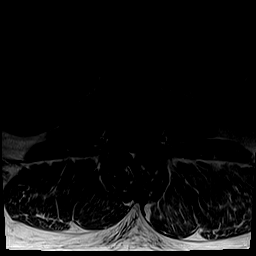
[im 30/33]
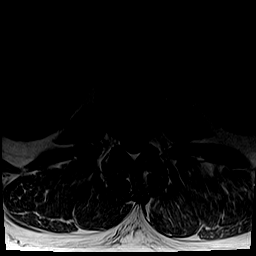
[im 33/33]
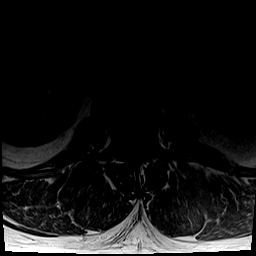

[48 of 48 positions shown; findings below may reference images not displayed]

FINDINGS: Alignment is normal.  

Bone marrow edema is present within both pedicles of L4 and L5.  There is a nondisplaced fracture within the left pedicle of L5 with surrounding bone marrow edema.  This edema also extends into the body posteriorly.  No other fracture can be identified in the lumbar spine.  

There is soft tissue edema posteriorly within paraspinal tissues at the L4-5 level.  

There is central disc protrusion at L4-5.  No nerve root impingement can be identified.  Other lumbar discs appear normal.  

The central spinal canal measures at the lower limits of normal at the L4-5 level.  Other levels appear normal.  

No foraminal stenosis identified.  

The conus medullaris is normal.  

In the abdomen, incidental note is made of a horseshoe kidney.  

Continued page 2…
IMPRESSION: 1. Nondisplaced fracture in the left pedicle of L5.  Associated bone marrow edema.  No other fracture identified.  

2. Bone marrow edema in the right pedicle of L5 and within both pedicles of L4.  This likely reflects stress related change.  No fracture at these sites can be identified.

3. Soft tissue edema within paraspinal tissues posteriorly at the L4-5 level.  

4. Focal central disc protrusion L4-5.  No nerve root impingement. 

5. Incidental horseshoe kidney.

## 2023-05-27 ENCOUNTER — Other Ambulatory Visit (HOSPITAL_COMMUNITY): Payer: Self-pay

## 2023-05-27 DIAGNOSIS — M545 Low back pain, unspecified: Secondary | ICD-10-CM

## 2023-06-03 ENCOUNTER — Ambulatory Visit: Attending: Physician Assistant

## 2023-06-03 ENCOUNTER — Other Ambulatory Visit: Payer: Self-pay

## 2023-06-03 DIAGNOSIS — E1129 Type 2 diabetes mellitus with other diabetic kidney complication: Secondary | ICD-10-CM | POA: Insufficient documentation

## 2023-06-03 DIAGNOSIS — R351 Nocturia: Secondary | ICD-10-CM | POA: Insufficient documentation

## 2023-06-03 LAB — COMPREHENSIVE METABOLIC PNL, FASTING
ALBUMIN: 3.8 g/dL (ref 3.5–5.0)
ALKALINE PHOSPHATASE: 68 U/L (ref 45–115)
ALT (SGPT): 15 U/L (ref <43)
ANION GAP: 9 mmol/L (ref 4–13)
AST (SGOT): 20 U/L (ref 11–34)
BILIRUBIN TOTAL: 0.8 mg/dL (ref 0.3–1.3)
BUN/CREA RATIO: 10 (ref 6–22)
BUN: 11 mg/dL (ref 8–25)
CALCIUM: 8.7 mg/dL (ref 8.6–10.2)
CHLORIDE: 106 mmol/L (ref 96–111)
CO2 TOTAL: 28 mmol/L (ref 22–30)
CREATININE: 1.09 mg/dL (ref 0.75–1.35)
ESTIMATED GFR - MALE: 82 mL/min/BSA (ref >=60)
GLUCOSE: 136 mg/dL — ABNORMAL HIGH (ref 70–99)
POTASSIUM: 3.3 mmol/L — ABNORMAL LOW (ref 3.5–5.1)
PROTEIN TOTAL: 7.5 g/dL (ref 6.0–7.9)
SODIUM: 143 mmol/L (ref 136–145)

## 2023-06-03 LAB — CBC WITH DIFF
BASOPHIL #: <0.1 10*3/uL (ref <=0.20)
BASOPHIL %: 0.6 %
EOSINOPHIL #: <0.1 10*3/uL (ref <=0.50)
EOSINOPHIL %: 1.7 %
HCT: 45.3 % (ref 38.9–52.0)
HGB: 14.7 g/dL (ref 13.4–17.5)
IMMATURE GRANULOCYTE #: <0.1 10*3/uL (ref <0.10)
IMMATURE GRANULOCYTE %: 0.3 % (ref 0.0–1.0)
LYMPHOCYTE #: 1.36 10*3/uL (ref 1.00–4.80)
LYMPHOCYTE %: 38.4 %
MCH: 29.8 pg (ref 26.0–32.0)
MCHC: 32.5 g/dL (ref 31.0–35.5)
MCV: 91.7 fL (ref 78.0–100.0)
MONOCYTE #: 0.48 10*3/uL (ref 0.20–1.10)
MONOCYTE %: 13.6 %
MPV: 10.6 fL (ref 8.7–12.5)
NEUTROPHIL #: 1.61 10*3/uL (ref 1.50–7.70)
NEUTROPHIL %: 45.4 %
PLATELETS: 249 10*3/uL (ref 150–400)
RBC: 4.94 10*6/uL (ref 4.50–6.10)
RDW-CV: 12.6 % (ref 11.5–15.5)
WBC: 3.5 10*3/uL — ABNORMAL LOW (ref 3.7–11.0)

## 2023-06-03 LAB — LIPID PANEL
CHOL/HDL RATIO: 3.4
CHOLESTEROL: 159 mg/dL (ref 100–200)
HDL CHOL: 47 mg/dL — ABNORMAL LOW (ref >=50)
LDL CALC: 95 mg/dL (ref <100)
NON-HDL: 112 mg/dL (ref <=190)
TRIGLYCERIDES: 88 mg/dL (ref <150)
VLDL CALC: 14 mg/dL (ref <30)

## 2023-06-03 LAB — URIC ACID: URIC ACID: 6.1 mg/dL (ref 3.5–7.5)

## 2023-06-03 LAB — HGA1C (HEMOGLOBIN A1C WITH EST AVG GLUCOSE)
ESTIMATED AVERAGE GLUCOSE: 126 mg/dL
HEMOGLOBIN A1C: 6 % — ABNORMAL HIGH (ref <=5.7)

## 2023-06-03 LAB — PSA SCREENING: PSA: 0.16 ng/mL (ref <=4.00)

## 2023-06-04 ENCOUNTER — Ambulatory Visit (HOSPITAL_COMMUNITY): Payer: Self-pay

## 2023-06-04 ENCOUNTER — Ambulatory Visit (HOSPITAL_COMMUNITY): Payer: Self-pay | Admitting: NUCLEAR MEDICINE

## 2023-07-24 ENCOUNTER — Other Ambulatory Visit (HOSPITAL_COMMUNITY): Payer: Self-pay | Admitting: Physician Assistant

## 2023-07-24 DIAGNOSIS — R0602 Shortness of breath: Secondary | ICD-10-CM

## 2023-07-25 ENCOUNTER — Other Ambulatory Visit (HOSPITAL_COMMUNITY): Payer: Self-pay | Admitting: Physician Assistant

## 2023-07-25 ENCOUNTER — Other Ambulatory Visit: Payer: Self-pay

## 2023-07-25 ENCOUNTER — Ambulatory Visit
Admission: RE | Admit: 2023-07-25 | Discharge: 2023-07-25 | Disposition: A | Discharge status: Home / Self Care | Source: Ambulatory Visit | Attending: Physician Assistant | Admitting: Physician Assistant

## 2023-07-25 DIAGNOSIS — R051 Acute cough: Secondary | ICD-10-CM | POA: Insufficient documentation

## 2023-07-26 DIAGNOSIS — R918 Other nonspecific abnormal finding of lung field: Secondary | ICD-10-CM

## 2023-07-29 ENCOUNTER — Encounter (HOSPITAL_COMMUNITY): Payer: Self-pay | Admitting: Physician Assistant

## 2023-07-30 ENCOUNTER — Ambulatory Visit (HOSPITAL_COMMUNITY): Admitting: NUCLEAR MEDICINE

## 2023-07-30 ENCOUNTER — Ambulatory Visit (HOSPITAL_COMMUNITY): Payer: Self-pay

## 2023-07-30 ENCOUNTER — Ambulatory Visit (HOSPITAL_COMMUNITY): Payer: Self-pay | Admitting: NUCLEAR MEDICINE

## 2023-07-30 ENCOUNTER — Ambulatory Visit: Admitting: NUCLEAR MEDICINE

## 2023-09-17 ENCOUNTER — Ambulatory Visit (HOSPITAL_COMMUNITY): Payer: Self-pay | Admitting: NUCLEAR MEDICINE

## 2023-09-17 ENCOUNTER — Ambulatory Visit (HOSPITAL_COMMUNITY): Payer: Self-pay

## 2023-10-02 ENCOUNTER — Ambulatory Visit (INDEPENDENT_AMBULATORY_CARE_PROVIDER_SITE_OTHER): Payer: Self-pay | Admitting: Physician Assistant

## 2023-10-02 NOTE — Telephone Encounter (Signed)
 Lucinda Millman, MD      10/02/23 12:41 PM  Unsigned Note      Ok to schedule. Thank you.

## 2023-10-02 NOTE — Telephone Encounter (Signed)
 Regarding: New Appointment  ----- Message from Reche ORN sent at 10/02/2023 11:57 AM EDT -----  Copied From CRM #[id number].Joshua Sim Kerns (Self) called to get permission to become a patient of Dr. Talaman. Please advise the pt at [phone]

## 2023-10-02 NOTE — Progress Notes (Signed)
[place] to schedule.    Thank you

## 2023-10-22 ENCOUNTER — Encounter (INDEPENDENT_AMBULATORY_CARE_PROVIDER_SITE_OTHER): Payer: Self-pay | Admitting: Family Medicine

## 2023-10-22 ENCOUNTER — Other Ambulatory Visit: Payer: Self-pay

## 2023-10-22 ENCOUNTER — Ambulatory Visit (INDEPENDENT_AMBULATORY_CARE_PROVIDER_SITE_OTHER): Payer: Self-pay | Admitting: Family Medicine

## 2023-10-22 VITALS — BP 132/80 | HR 74 | Temp 97.2°F | Resp 18 | Ht 72.5 in | Wt 284.8 lb

## 2023-10-22 DIAGNOSIS — E785 Hyperlipidemia, unspecified: Secondary | ICD-10-CM | POA: Insufficient documentation

## 2023-10-22 DIAGNOSIS — Z7984 Long term (current) use of oral hypoglycemic drugs: Secondary | ICD-10-CM

## 2023-10-22 DIAGNOSIS — Z7901 Long term (current) use of anticoagulants: Secondary | ICD-10-CM

## 2023-10-22 DIAGNOSIS — E876 Hypokalemia: Secondary | ICD-10-CM | POA: Insufficient documentation

## 2023-10-22 DIAGNOSIS — G4733 Obstructive sleep apnea (adult) (pediatric): Secondary | ICD-10-CM | POA: Insufficient documentation

## 2023-10-22 DIAGNOSIS — I10 Essential (primary) hypertension: Secondary | ICD-10-CM | POA: Insufficient documentation

## 2023-10-22 DIAGNOSIS — Z1211 Encounter for screening for malignant neoplasm of colon: Secondary | ICD-10-CM

## 2023-10-22 DIAGNOSIS — E1129 Type 2 diabetes mellitus with other diabetic kidney complication: Secondary | ICD-10-CM | POA: Insufficient documentation

## 2023-10-22 DIAGNOSIS — R809 Proteinuria, unspecified: Secondary | ICD-10-CM

## 2023-10-22 DIAGNOSIS — Z0000 Encounter for general adult medical examination without abnormal findings: Secondary | ICD-10-CM

## 2023-10-22 DIAGNOSIS — Z6838 Body mass index (BMI) 38.0-38.9, adult: Secondary | ICD-10-CM

## 2023-10-22 DIAGNOSIS — I82562 Chronic embolism and thrombosis of left calf muscular vein: Secondary | ICD-10-CM | POA: Insufficient documentation

## 2023-10-22 DIAGNOSIS — Z7189 Other specified counseling: Secondary | ICD-10-CM

## 2023-10-22 DIAGNOSIS — Z7689 Persons encountering health services in other specified circumstances: Secondary | ICD-10-CM

## 2023-10-22 DIAGNOSIS — Z1159 Encounter for screening for other viral diseases: Secondary | ICD-10-CM

## 2023-10-22 DIAGNOSIS — Z23 Encounter for immunization: Secondary | ICD-10-CM

## 2023-10-22 MED ORDER — ATORVASTATIN 40 MG TABLET
40.0000 mg | ORAL_TABLET | Freq: Every evening | ORAL | 3 refills | Status: AC
Start: 2023-10-22 — End: ?

## 2023-10-22 MED ORDER — CAPVAXIVE 0.5 ML INTRAMUSCULAR SYRINGE
0.5000 mL | INJECTION | Freq: Once | INTRAMUSCULAR | 0 refills | Status: AC
Start: 2023-10-22 — End: 2023-10-22

## 2023-10-22 MED ORDER — TIRZEPATIDE (WEIGHT LOSS) 2.5 MG/0.5 ML SUBCUTANEOUS PEN INJECTOR
2.5000 mg | PEN_INJECTOR | SUBCUTANEOUS | 0 refills | Status: DC
Start: 2023-10-22 — End: 2023-10-29

## 2023-10-22 NOTE — Nursing Note (Signed)
 10/22/23 1308   Depression Screen   Little interest or pleasure in doing things. 0   Feeling down, depressed, or hopeless 0   PHQ 2 Total 0

## 2023-10-22 NOTE — Progress Notes (Signed)
 PRIMARY CARE, [facility]  [address] [place] [address]           Name: Austin Richmond  MRN: [id number]  Date of Birth: [birth year]    Encounter Date: 10/22/2023    Chief Complaint   Patient presents with    Establish Care     OUTPATIENT PROGRESS NOTE    Subjective:   Patient ID:  Austin Richmond is a pleasant 53 y.o. male.    History of Present Illness:  Pt presents for adult exam.      Diet:  diabetic diet  Activity level: No Weakness   Sleep: Interrupted because he has to urinate twice at night  Compliant with taking medications: Yes    Acute issues:  no complaints     The history is provided by the patient.    Allergies:   Allergies[1]    Medications:   AMLODIPINE BESYLATE (NORVASC ORAL), Take 10 mg by mouth Once a day   apixaban (ELIQUIS) 5 mg Oral Tablet, Take 5 mg by mouth Twice daily    atorvastatin  (LIPITOR) 20 mg Oral Tablet, Take 20 mg by mouth Every evening RX confirmed with [facility] pharmacy  metformin HCl (METFORMIN ORAL), Take 500 mg by mouth Twice daily   potassium chloride (K-DUR) 10 mEq Oral Tab Sust.Rel. Particle/Crystal, Take 10 mEq by mouth Once a day  valsartan (DIOVAN) 160 mg Oral Tablet, Take 160 mg by mouth Once a day Pt states he is on valsartan and not     Immunization History:     Immunization History   Administered Date(s) Administered    AFLURIA-Influenza Vaccine,3 Yr+ 12/07/2017    Influenza Vaccine, 6 month-adult 12/07/2017, 02/08/2018    Pneumovax 04/01/2018     Past Medical History:     Past Medical History:   Diagnosis Date    Arthritis     CPAP (continuous positive airway pressure) dependence     Deep vein thrombosis (DVT)     left leg    Diabetes mellitus, type 2     Gout     History of anesthesia complications     pt never had anesthesia    HTN (hypertension)     Hyperlipidemia     Neck problem     arthritis neck    Sleep apnea     Type 2 diabetes mellitus      Past Surgical History:   Procedure Laterality Date    LESION EXCISION OROPHARYNX (UVULA)  N/A 08/18/2019    Performed by Glena Cough, MD at [facility]    MOUTH SURGERY       Family History:     Family Medical History:       Problem Relation (Age of Onset)    Diabetes Father, Brother    Hypertension (High Blood Pressure) Mother          Social History:   [name]  reports that he has never smoked. He has never used smokeless tobacco. He reports that he does not currently use drugs after having used the following drugs: Marijuana. He reports being sexually active and has had partner(s) who are male. He reports that he does not drink alcohol.    Review of Systems: Other than ROS in HPI, all other systems are negative.    Objective:   Vitals:    Vitals:    10/22/23 1308   BP: 132/80   Pulse: 74   Resp: 18   Temp:  36.2 C (97.2 F)   TempSrc: Temporal   SpO2: 97%   Weight: 129 kg (284 lb 12.8 oz)   Height: 1.842 m (6' 0.5)   BMI: 38.09      Body mass index is 38.09 kg/m.    Constitutional: Alert, well developed, well nourished  HEENT:  Head: NC/AT    Eyes: Sclera anicteric, conjunctiva not injected    Ears: No pain or cerumen impaction    Nose: No discharge    Throat: MMM, posterior pharynx without erythema or exudate  Neck:   Supple with normal ROM  Cardiovascular: RRR, normal S1/S2, no murmurs  Pulmonary:  CTAB, equal air entry, nonlabored, no wheezes/crackles/rhonchi  Abdomen:   NABS, NT/ND, soft  Musculoskeletal:  No injury, no edema  Neurological:   Alert, oriented x 3, no abnormal tone  Skin:     Warm, pink, dry, no jaundice, no pallor, no cyanosis  Psychiatric:  Normal  judgment, and thought content    Depression screening is negative. PHQ 2 Total: 0     ASSESSMENT AND PLAN:     ICD-10-CM    1. Encounter to establish care  Z76.89 Advised to get updated blood work.      2. Counseling on health promotion and disease prevention  Z71.89 CBC  Health screening guidelines provided.  Shingles vaccine recommended and provided information. He just wants to get tetanus vaccine today.      COMPREHENSIVE METABOLIC PNL, FASTING     HGA1C (HEMOGLOBIN A1C WITH EST AVG GLUCOSE)     THYROID STIMULATING HORMONE WITH FREE T4 REFLEX     MAGNESIUM     URINALYSIS WITH REFLEX MICROSCOPIC AND CULTURE IF POSITIVE      3. 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      4. Severe obesity (BMI 35.0-39.9) with comorbidity (CMS HCC)  E66.01 CBC  Continue lifestyle changes to lose weight and Zepbound  as adjunct. Added Zepbound  as adjunct to healthy diabetic diet and regular exercise to help lose weight.  Medicine will be titrated to next dose monthly up to 15 mg weekly if tolerated. Target weight loss is 1 lb a week.      COMPREHENSIVE METABOLIC PNL, FASTING     THYROID STIMULATING HORMONE WITH FREE T4 REFLEX     tirzepatide , weight loss, (ZEPBOUND ) 2.5 mg/0.5 mL Subcutaneous Pen Injector - new      5. Hypokalemia  E87.6 COMPREHENSIVE METABOLIC PNL, FASTING     MAGNESIUM  Continue valsartan and potassium chloride.  Advised to eat fruits and vegetables regularly as part of healthy diet.      6. Dyslipidemia  E78.5 COMPREHENSIVE METABOLIC PNL, FASTING     THYROID STIMULATING HORMONE WITH FREE T4 REFLEX     atorvastatin  (LIPITOR) 40 mg Oral Tablet - dose increased  LDL goal is less than 70.  Will increase atorvastatin  and continue lifestyle changes.      7. Need for pneumococcal vaccine  Z23 pneumo 21-val conj-dip crm,PF, (CAPVAXIVE ) 0.5 mL IntraMUSCULAR Syringe  He can get the vaccine at pharmacy.      8. OSA on CPAP  G47.33 tirzepatide , weight loss, (ZEPBOUND ) 2.5 mg/0.5  mL Subcutaneous Pen Injector  Continue CPAP and gradual weight loss.      9. Screening for viral disease  Z11.59  HEPATITIS B SURFACE ANTIBODY  He can get hepatitis-B vaccine if no immunity,     HEPATITIS C ANTIBODY SCREEN WITH REFLEX TO HCV PCR     HIV1/HIV2 Screen, Combined Antigen and Antibody      10. Colon cancer screening  Z12.11 FIT Test - External  He prefers stool test for colon cancer screening.      11. Need for Tdap vaccination  Z23 Tdap Vaccine (>10 years)- Boostrix      12. 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  Continue Eliquis, exercise and gradual weight loss.      13. Essential hypertension  I10 Improved.  Continue blood pressure medicines and potassium chloride.        Data reviewed:   PROSTATE SPECIFIC ANTIGEN   Lab Results   Component Value Date/Time    PROSSPECAG 0.16 06/03/2023 07:07 AM        Lab Results   Component Value Date/Time    HA1C 6.0 (H) 06/03/2023 07:07 AM    MICALBCRERAT 43.0 (H) 05/24/2021 11:22 AM    BUN 11 06/03/2023 07:07 AM    CREATININE 1.09 06/03/2023 07:07 AM    GFR 82 06/03/2023 07:07 AM    GLUCOSE 136 (H) 06/03/2023 07:07 AM    CALCIUM 8.7 06/03/2023 07:07 AM    SODIUM 143 06/03/2023 07:07 AM    POTASSIUM 3.3 (L) 06/03/2023 07:07 AM    WBC 3.5 (L) 06/03/2023 07:07 AM    HGB 14.7 06/03/2023 07:07 AM    HCT 45.3 06/03/2023 07:07 AM    PLTCNT 249 06/03/2023 07:07 AM    AST 20 06/03/2023 07:07 AM    ALT 15 06/03/2023 07:07 AM    ALKPHOS 68 06/03/2023 07:07 AM    CHOLESTEROL 159 06/03/2023 07:07 AM    HDLCHOL 47 (L) 06/03/2023 07:07 AM    LDLCHOL 95 06/03/2023 07:07 AM    TRIG 88 06/03/2023 07:07 AM     Orders Placed This Encounter    Tdap Vaccine (>10 years)- Boostrix    MICROALBUMIN/CREATININE RATIO, URINE, RANDOM    CBC    COMPREHENSIVE METABOLIC PNL, FASTING    HGA1C (HEMOGLOBIN A1C WITH EST AVG GLUCOSE)    THYROID STIMULATING HORMONE WITH FREE T4 REFLEX    MAGNESIUM    URINALYSIS WITH REFLEX MICROSCOPIC AND CULTURE IF POSITIVE    HEPATITIS B  SURFACE ANTIBODY    HEPATITIS C ANTIBODY SCREEN WITH REFLEX TO HCV PCR    HIV1/HIV2 Screen, Combined Antigen and Antibody    FIT Test - External    External Referral to OPHTHAMOLOGIST/OPTOMETRIST    atorvastatin  (LIPITOR) 40 mg Oral Tablet    pneumo 21-val conj-dip crm,PF, (CAPVAXIVE ) 0.5 mL IntraMUSCULAR Syringe    tirzepatide , weight loss, (ZEPBOUND ) 2.5 mg/0.5 mL Subcutaneous Pen Injector     Recheck blood work before next office visit. Above plan was discussed with the patient and patient verbalized understanding. Patient agreed with above treatment and plan. Questions answered to patient's satisfaction. Risks, benefits, and alternatives to above treatment discussed with patient.   Follow-up in 1 month, sooner should new symptoms or problems arise.   He  was advised to contact the office if with any questions or concerns.    Vernell PARAS.  Lucinda, MD  DEPT PHONE: [phone]  DEPT FAX: [phone]  Note: This chart was transcribed using voice recognition software and may contain unintended word substitution or minor typographical errors.         [1]   Allergies  Allergen Reactions    Grass Pollen     Seasonale [Levonorgestrel-Ethinyl Estrad]

## 2023-10-22 NOTE — Nursing Note (Signed)
 Pt is here today to reestablish. Hasn't been since 2019. Never had a colonoscopy. Hasn't diabetic eye exam.

## 2023-10-29 ENCOUNTER — Other Ambulatory Visit (INDEPENDENT_AMBULATORY_CARE_PROVIDER_SITE_OTHER): Payer: Self-pay | Admitting: Family Medicine

## 2023-10-29 ENCOUNTER — Encounter (INDEPENDENT_AMBULATORY_CARE_PROVIDER_SITE_OTHER): Payer: Self-pay | Admitting: Family Medicine

## 2023-10-29 DIAGNOSIS — G4733 Obstructive sleep apnea (adult) (pediatric): Secondary | ICD-10-CM

## 2023-10-29 MED ORDER — TIRZEPATIDE (WEIGHT LOSS) 2.5 MG/0.5 ML SUBCUTANEOUS PEN INJECTOR
2.5000 mg | PEN_INJECTOR | SUBCUTANEOUS | 0 refills | Status: DC
Start: 2023-10-29 — End: 2023-12-24

## 2023-12-03 ENCOUNTER — Other Ambulatory Visit: Payer: Self-pay

## 2023-12-03 ENCOUNTER — Ambulatory Visit (INDEPENDENT_AMBULATORY_CARE_PROVIDER_SITE_OTHER): Payer: Self-pay | Admitting: Family Medicine

## 2023-12-03 ENCOUNTER — Ambulatory Visit: Attending: Family Medicine

## 2023-12-03 DIAGNOSIS — R809 Proteinuria, unspecified: Secondary | ICD-10-CM | POA: Insufficient documentation

## 2023-12-03 DIAGNOSIS — Z1159 Encounter for screening for other viral diseases: Secondary | ICD-10-CM | POA: Insufficient documentation

## 2023-12-03 DIAGNOSIS — Z7189 Other specified counseling: Secondary | ICD-10-CM | POA: Insufficient documentation

## 2023-12-03 DIAGNOSIS — E785 Hyperlipidemia, unspecified: Secondary | ICD-10-CM | POA: Insufficient documentation

## 2023-12-03 DIAGNOSIS — E1129 Type 2 diabetes mellitus with other diabetic kidney complication: Secondary | ICD-10-CM | POA: Insufficient documentation

## 2023-12-03 DIAGNOSIS — E876 Hypokalemia: Secondary | ICD-10-CM | POA: Insufficient documentation

## 2023-12-03 LAB — URINALYSIS, MACRO/MICRO
BILIRUBIN: NEGATIVE mg/dL
BLOOD: NEGATIVE mg/dL
GLUCOSE: >=1000 mg/dL — AB
KETONES: NEGATIVE mg/dL
LEUKOCYTES: NEGATIVE WBCs/uL
NITRITE: NEGATIVE
PH: 6.5 (ref 5.0–8.0)
PROTEIN: NEGATIVE mg/dL
SPECIFIC GRAVITY: 1.02 (ref 1.005–1.030)
UROBILINOGEN: NEGATIVE mg/dL

## 2023-12-03 LAB — CBC
HCT: 44.5 % (ref 38.9–52.0)
HGB: 14.7 g/dL (ref 13.4–17.5)
MCH: 29.7 pg (ref 26.0–32.0)
MCHC: 33 g/dL (ref 31.0–35.5)
MCV: 89.9 fL (ref 78.0–100.0)
MPV: 10 fL (ref 8.7–12.5)
PLATELETS: 240 x10ˆ3/uL (ref 150–400)
RBC: 4.95 x10ˆ6/uL (ref 4.50–6.10)
RDW-CV: 12.5 % (ref 11.5–15.5)
WBC: 3.5 x10ˆ3/uL — ABNORMAL LOW (ref 3.7–11.0)

## 2023-12-03 LAB — HEPATITIS B SURFACE ANTIBODY: HBV SURFACE ANTIBODY QUANTITATIVE: 2.1 m[IU]/mL (ref <8.00)

## 2023-12-03 LAB — HEPATITIS C ANTIBODY SCREEN WITH REFLEX TO HCV PCR: HCV ANTIBODY QUALITATIVE: NEGATIVE

## 2023-12-03 LAB — COMPREHENSIVE METABOLIC PNL, FASTING
ALBUMIN: 4.1 g/dL (ref 3.5–5.0)
ALKALINE PHOSPHATASE: 73 U/L (ref 45–115)
ALT (SGPT): 20 U/L (ref <43)
ANION GAP: 10 mmol/L (ref 4–13)
AST (SGOT): 22 U/L (ref 11–34)
BILIRUBIN TOTAL: 0.7 mg/dL (ref 0.3–1.3)
BUN/CREA RATIO: 17 (ref 6–22)
BUN: 17 mg/dL (ref 8–25)
CALCIUM: 9.3 mg/dL (ref 8.6–10.2)
CHLORIDE: 105 mmol/L (ref 96–111)
CO2 TOTAL: 28 mmol/L (ref 22–30)
CREATININE: 0.99 mg/dL (ref 0.75–1.35)
GLUCOSE: 110 mg/dL — ABNORMAL HIGH (ref 70–99)
POTASSIUM: 3.6 mmol/L (ref 3.5–5.1)
PROTEIN TOTAL: 7.4 g/dL (ref 6.0–7.9)
SODIUM: 143 mmol/L (ref 136–145)
eGFRcr - MALE: >90 mL/min/1.73mˆ2 (ref >=60)

## 2023-12-03 LAB — MICROALBUMIN/CREATININE RATIO, URINE, RANDOM
CREATININE RANDOM URINE: 60.2 mg/dL
MICROALBUMIN RANDOM URINE: 9.5 mg/dL
MICROALBUMIN/CREATININE RATIO RANDOM URINE: 157.8 mg/g — ABNORMAL HIGH (ref <30.0)

## 2023-12-03 LAB — HGA1C (HEMOGLOBIN A1C WITH EST AVG GLUCOSE)
ESTIMATED AVERAGE GLUCOSE: 97 mg/dL
HEMOGLOBIN A1C: 5 % (ref <=5.7)

## 2023-12-03 LAB — MAGNESIUM: MAGNESIUM: 1.9 mg/dL (ref 1.8–2.6)

## 2023-12-03 LAB — HIV1/HIV2 SCREEN, COMBINED ANTIGEN AND ANTIBODY: HIV SCREEN, COMBINED ANTIGEN & ANTIBODY: NEGATIVE

## 2023-12-03 LAB — THYROID STIMULATING HORMONE WITH FREE T4 REFLEX: TSH: 0.851 u[IU]/mL (ref 0.350–4.940)

## 2023-12-24 ENCOUNTER — Encounter (INDEPENDENT_AMBULATORY_CARE_PROVIDER_SITE_OTHER): Payer: Self-pay | Admitting: Family Medicine

## 2023-12-24 ENCOUNTER — Ambulatory Visit (INDEPENDENT_AMBULATORY_CARE_PROVIDER_SITE_OTHER): Payer: Self-pay | Admitting: Family Medicine

## 2023-12-24 ENCOUNTER — Other Ambulatory Visit: Payer: Self-pay

## 2023-12-24 VITALS — BP 144/94 | HR 84 | Temp 97.4°F | Resp 18 | Ht 72.5 in | Wt 291.0 lb

## 2023-12-24 DIAGNOSIS — R809 Proteinuria, unspecified: Secondary | ICD-10-CM

## 2023-12-24 DIAGNOSIS — I10 Essential (primary) hypertension: Secondary | ICD-10-CM

## 2023-12-24 DIAGNOSIS — E1129 Type 2 diabetes mellitus with other diabetic kidney complication: Secondary | ICD-10-CM

## 2023-12-24 DIAGNOSIS — Z7901 Long term (current) use of anticoagulants: Secondary | ICD-10-CM

## 2023-12-24 DIAGNOSIS — E785 Hyperlipidemia, unspecified: Secondary | ICD-10-CM

## 2023-12-24 DIAGNOSIS — G4733 Obstructive sleep apnea (adult) (pediatric): Secondary | ICD-10-CM

## 2023-12-24 DIAGNOSIS — Z6838 Body mass index (BMI) 38.0-38.9, adult: Secondary | ICD-10-CM

## 2023-12-24 DIAGNOSIS — Z9989 Dependence on other enabling machines and devices: Secondary | ICD-10-CM

## 2023-12-24 DIAGNOSIS — D72819 Decreased white blood cell count, unspecified: Secondary | ICD-10-CM | POA: Insufficient documentation

## 2023-12-24 DIAGNOSIS — E1169 Type 2 diabetes mellitus with other specified complication: Secondary | ICD-10-CM

## 2023-12-24 DIAGNOSIS — Z7985 Long-term (current) use of injectable non-insulin antidiabetic drugs: Secondary | ICD-10-CM

## 2023-12-24 MED ORDER — VALSARTAN 320 MG TABLET
320.0000 mg | ORAL_TABLET | Freq: Every day | ORAL | 3 refills | Status: AC
Start: 2023-12-24 — End: ?

## 2023-12-24 MED ORDER — TIRZEPATIDE (WEIGHT LOSS) 5 MG/0.5 ML SUBCUTANEOUS PEN INJECTOR
5.0000 mg | PEN_INJECTOR | SUBCUTANEOUS | 0 refills | Status: AC
Start: 2023-12-24 — End: ?

## 2023-12-24 NOTE — Nursing Note (Signed)
 Pt is here today for one month follow up. No concerns. Hasn't had diabetic eye exam yet.

## 2023-12-24 NOTE — Progress Notes (Signed)
 PRIMARY CARE, [facility]  [address] [place] [address]           Name: Austin Richmond  MRN: [id number]  Date of Birth: 1970/05/15    Encounter Date: 12/24/2023    Chief Complaint   Patient presents with    Follow Up     Hypertension, OSA, obesity, diabetes, leukopenia, hyperlipidemia    Results     Last office visit with the provider:10/22/2023     SUBJECTIVE:  Austin Richmond is a 53 y.o. male,Black or African American. Patient is gaining weight on current dose of Zepbound  with elevated blood pressure. He does not have the following symptoms today: fever, chills, cough, nasal congestion, sore throat, abdominal pain, headache, dizziness, eye pain or irritation, epistaxis, chest discomfort, stroke symptoms, shortness of breath, dyspnea on exertion or palpitations. Side effects of medications: no current medication side effects.  He does not want to get recommended flu shot.    Current Outpatient Medications   Medication Sig    AMLODIPINE BESYLATE (NORVASC ORAL) Take 10 mg by mouth Once a day     apixaban (ELIQUIS) 5 mg Oral Tablet Take 5 mg by mouth Twice daily      atorvastatin  (LIPITOR) 40 mg Oral Tablet Take 1 Tablet (40 mg total) by mouth Every evening Indications: high cholesterol and high triglycerides, treatment to prevent a heart attack    metformin HCl (METFORMIN ORAL) Take 500 mg by mouth Twice daily     potassium chloride (K-DUR) 10 mEq Oral Tab Sust.Rel. Particle/Crystal Take 10 mEq by mouth Once a day    tirzepatide , weight loss, (ZEPBOUND ) 2.5 mg/0.5 mL Subcutaneous Pen Injector Inject 0.5 mL (2.5 mg total) under the skin Every 7 days Indications: sleep apnea due to airway obstruction, type 2 diabetes mellitus    valsartan (DIOVAN) 160 mg Oral Tablet Take 160 mg by mouth Once a day Pt states he is on valsartan and not lisinopril-et, confirmed with [facility] pharmacy      Allergies[1]    Past Surgical History:   Procedure Laterality Date    MOUTH SURGERY       Social  History[2]    OBJECTIVE:   The patient appears to be in no acute distress.  Vitals: BP (!) 144/94 (Site: Right Arm, Patient Position: Sitting, Cuff Size: Adult Large)   Pulse 84   Temp 36.3 C (97.4 F) (Temporal)   Resp 18   Ht 1.842 m (6' 0.5)   Wt 132 kg (291 lb)   SpO2 98%   BMI 38.92 kg/m   General: alert, no acute distress  Head: normocephalic, atraumatic  Eye: EOM intact, normal conjunctiva  Respiratory: Good diaphragmatic excursion. Lungs clear.  Heart: RRR  Extremities: Ambulatory, no edema  Neurologic: Awake, alert, oriented, speech clear and coherent  Skin: No jaundice, warm   Psychiatric: Cooperative, normal judgment    ASSESSMENT AND PLAN:     ICD-10-CM    1. Essential hypertension  I10 valsartan (DIOVAN) 320 mg Oral Tablet - dose increased  His blood pressure is higher.  Advised to monitor blood pressure at home.  He will come back as nurse visit in 1-2 weeks for blood pressure check.  Continue amlodipine, Eliquis, potassium chloride, low-salt diet gradual weight loss recommended.      2. Severe obesity (BMI 35.0-39.9) with comorbidity (CMS HCC)  E66.01 tirzepatide , weight loss, (ZEPBOUND ) 5 mg/0.5 mL Subcutaneous Pen Injector - dose increased     LIPID PANEL  He is not losing weight on lower dose.  Continue Zepbound  as adjunct to healthy diet and regular exercise to help lose weight.  Medicine will be titrated to next dose monthly up to 15 mg weekly if tolerated. Target weight loss is 1 lb a week.  Advised to report weight loss progress next month.      3. OSA on CPAP  G47.33 tirzepatide , weight loss, (ZEPBOUND ) 5 mg/0.5 mL Subcutaneous Pen Injector  Stable.  Continue CPAP and gradual weight loss recommended.      4. Type 2 diabetes mellitus with hyperlipidemia (CMS HCC)  (CMS HCC)  E11.69 tirzepatide , weight loss, (ZEPBOUND ) 5 mg/0.5 mL Subcutaneous Pen Injector    E78.5 LIPID PANEL     External Referral to OPHTHAMOLOGIST/OPTOMETRIST  His most recent A1c is normal. Continue well-balanced ADA  meals, metformin, atorvastatin , checking feet regularly, regular exercise as tolerated, weight management, control of blood pressure and cholesterol, regular eye checks.      5. 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  Will increase dose of valsartan daily.    R80.9 LIPID PANEL     External Referral to OPHTHAMOLOGIST/OPTOMETRIST      6. Chronic leukopenia  D72.819 Stable for 2 years.  Continue to monitor periodically.        Data reviewed:   Lab Results   Component Value Date/Time    HA1C 5.0 12/03/2023 12:27 PM    MICALBCRERAT 157.8 (H) 12/03/2023 12:27 PM    BUN 17 12/03/2023 12:27 PM    CREATININE 0.99 12/03/2023 12:27 PM    GFR >90 12/03/2023 12:27 PM    GLUCOSE 110 (H) 12/03/2023 12:27 PM    CALCIUM 9.3 12/03/2023 12:27 PM    SODIUM 143 12/03/2023 12:27 PM    POTASSIUM 3.6 12/03/2023 12:27 PM    WBC 3.5 (L) 12/03/2023 12:27 PM    HGB 14.7 12/03/2023 12:27 PM    HCT 44.5 12/03/2023 12:27 PM    PLTCNT 240 12/03/2023 12:27 PM    AST 22 12/03/2023 12:27 PM    ALT 20 12/03/2023 12:27 PM    ALKPHOS 73 12/03/2023 12:27 PM    CHOLESTEROL 159 06/03/2023 07:07 AM    HDLCHOL 47 (L) 06/03/2023 07:07 AM    LDLCHOL 95 06/03/2023 07:07 AM    TRIG 88 06/03/2023 07:07 AM    TSH 0.851 12/03/2023 12:27 PM    MAGNESIUM 1.9 12/03/2023 12:27 PM     PROSTATE SPECIFIC ANTIGEN   Lab Results   Component Value Date/Time    PROSSPECAG 0.16 06/03/2023 07:07 AM        Orders Placed This Encounter    LIPID PANEL    External Referral to OPHTHAMOLOGIST/OPTOMETRIST    tirzepatide , weight loss, (ZEPBOUND ) 5 mg/0.5 mL Subcutaneous Pen Injector    valsartan (DIOVAN) 320 mg Oral Tablet     Recheck blood work before next office visit. Above plan was discussed with the patient and patient verbalized understanding. Patient agreed with above treatment and plan. Questions answered to patient's satisfaction. Risks, benefits, and alternatives to above treatment  discussed with patient.   Follow-up in 6 months , sooner should new symptoms or problems arise.   He  was advised to contact the office if with any questions or concerns.    Vernell PARAS. Lucinda, MD  DEPT PHONE: [phone]  DEPT FAX: [phone]  Note: This chart was transcribed using voice recognition software and may  contain unintended word substitution or minor typographical errors.         [1]   Allergies  Allergen Reactions    Grass Pollen     Seasonale [Levonorgestrel-Ethinyl Estrad]    [2]   Social History  Tobacco Use    Smoking status: Never    Smokeless tobacco: Never   Vaping Use    Vaping status: Never Used   Substance Use Topics    Alcohol use: No    Drug use: Not Currently     Types: Marijuana     Comment: daily-medical marijuana

## 2023-12-31 ENCOUNTER — Telehealth (INDEPENDENT_AMBULATORY_CARE_PROVIDER_SITE_OTHER): Payer: Self-pay | Admitting: Family Medicine

## 2023-12-31 DIAGNOSIS — I10 Essential (primary) hypertension: Secondary | ICD-10-CM

## 2023-12-31 MED ORDER — AMLODIPINE 10 MG TABLET
10.0000 mg | ORAL_TABLET | Freq: Every day | ORAL | 1 refills | Status: AC
Start: 2023-12-31 — End: ?

## 2023-12-31 NOTE — Telephone Encounter (Addendum)
 Last Visit:12/24/2023     Upcoming appointments: Visit date not found       Please schedule follow-up visit in the next 6 months.  Thank you.    [name], RN  12/31/2023, 08:16       Copied From CRM [id number].  Austin Richmond (Self) called to request a prescription refill.    He states he is needing this filled by the office as he has switched doctors and this is no longer filled by his previous PCP    AMLODIPINE  BESYLATE (NORVASC ORAL), Take 10 mg by mouth Once a day        Preferred Pharmacy     [id number] [place] - [address] AT [facility] [address]   [address] [place] [address]   Phone: [phone] Fax: [phone]   Hours: Not open 24 hours

## 2024-01-01 ENCOUNTER — Ambulatory Visit (INDEPENDENT_AMBULATORY_CARE_PROVIDER_SITE_OTHER): Payer: Self-pay | Admitting: Family Medicine

## 2024-01-01 DIAGNOSIS — G4733 Obstructive sleep apnea (adult) (pediatric): Secondary | ICD-10-CM

## 2024-01-01 NOTE — Telephone Encounter (Signed)
 Regarding: Clinical Question  ----- Message from Alan BRAVO sent at 01/01/2024 11:34 AM EST -----  Copied From CRM #[id number].Austin Richmond (Self) called with a clinical question.   Requesting a script for a new power cord for their CPAP machine sent to [facility]...  Please advise.

## 2024-01-06 ENCOUNTER — Other Ambulatory Visit (INDEPENDENT_AMBULATORY_CARE_PROVIDER_SITE_OTHER): Payer: Self-pay | Admitting: Family Medicine

## 2024-01-06 MED ORDER — POTASSIUM CHLORIDE ER 10 MEQ TABLET,EXTENDED RELEASE(PART/CRYST)
10.0000 meq | ORAL_TABLET | Freq: Every day | ORAL | 3 refills | Status: AC
Start: 2024-01-06 — End: ?

## 2024-01-06 NOTE — Telephone Encounter (Signed)
 Last Visit:12/24/2023     Upcoming appointments: 06/30/2024           [name], RN  01/06/2024, 13:09       Summary: Refill Request    Copied From CRM [phone].  Joshua Sim Kerns (Self) called to request a prescription refill.     [id number] [place] - [address] AT [facility] [address]   [address] [place] [address]   Phone: [phone] Fax: [phone]   Hours: Not open 24 hours     --potassium chloride (K-DUR) 10 mEq Oral Tab Sust.Rel. Particle/Crystal

## 2024-03-03 ENCOUNTER — Ambulatory Visit (INDEPENDENT_AMBULATORY_CARE_PROVIDER_SITE_OTHER): Payer: Self-pay | Admitting: Family Medicine

## 2024-03-03 NOTE — Telephone Encounter (Signed)
 Regarding: New Appointment  ----- Message from Trout P sent at 03/03/2024  1:48 PM EST -----  Copied From CRM #[id number].Joshua Sim Kerns (Self) called to schedule an appointment. Pt was recently seen at [facility] for surgery related to Afib, he did not know the dates. He is wanting a blood pressure cuff through [facility]. Pt is scheduled for the first available date that he states he can come in on 01/21

## 2024-03-10 ENCOUNTER — Encounter (INDEPENDENT_AMBULATORY_CARE_PROVIDER_SITE_OTHER): Payer: Self-pay | Admitting: Internal Medicine

## 2024-03-25 ENCOUNTER — Ambulatory Visit (INDEPENDENT_AMBULATORY_CARE_PROVIDER_SITE_OTHER): Payer: Self-pay | Admitting: Family Medicine

## 2024-03-25 NOTE — Telephone Encounter (Signed)
 Spoke with patient and pharmacist. [facility] and than he switched PCP here. Pharmacy states the medication was last prescribed 12/10/2023 and he took the medication 1 tablet by mouth everyday

## 2024-03-25 NOTE — Telephone Encounter (Signed)
 Last Visit:12/24/2023     Upcoming appointments: 06/30/2024           Austin Richmond  03/25/2024, 10:10

## 2024-03-25 NOTE — Telephone Encounter (Signed)
 Regarding: Refill Request  ----- Message from Angeline ORN sent at 03/25/2024 10:03 AM EST -----  Copied From CRM [phone].Joshua Sim Kerns (Self) called to request a prescription refill.  Please call back     Medication Quantity Refills Start End  empagliflozin (JARDIANCE) 25 mg Oral Tablet (Discontinued) - -  08/11/2019  Sig:   Take 25 mg by mouth Once a day    Route:   Oral    Reason for Discontinue:   Patient Discharge      Preferred Pharmacy     [id number] [place] - [address] AT [facility] [address]    [address] [place] [address]    Phone: [phone] Fax: [phone]    Hours: Not open 24 hours

## 2024-03-25 NOTE — Telephone Encounter (Signed)
 Jardiance not on medication profile.

## 2024-06-30 ENCOUNTER — Encounter (INDEPENDENT_AMBULATORY_CARE_PROVIDER_SITE_OTHER): Payer: Self-pay | Admitting: Family Medicine
